# Patient Record
Sex: Female | Born: 1965 | Race: White | Hispanic: No | Marital: Married | Smoking: Never smoker
Health system: Southern US, Community
[De-identification: ages and names within clinical notes are randomized; demographics above are authoritative.]

## PROBLEM LIST (undated history)

## (undated) DIAGNOSIS — B009 Herpesviral infection, unspecified: Secondary | ICD-10-CM

## (undated) DIAGNOSIS — M1990 Unspecified osteoarthritis, unspecified site: Secondary | ICD-10-CM

## (undated) DIAGNOSIS — S4290XA Fracture of unspecified shoulder girdle, part unspecified, initial encounter for closed fracture: Secondary | ICD-10-CM

## (undated) DIAGNOSIS — Z8742 Personal history of other diseases of the female genital tract: Secondary | ICD-10-CM

## (undated) DIAGNOSIS — M8580 Other specified disorders of bone density and structure, unspecified site: Secondary | ICD-10-CM

## (undated) DIAGNOSIS — Z87442 Personal history of urinary calculi: Secondary | ICD-10-CM

## (undated) DIAGNOSIS — I319 Disease of pericardium, unspecified: Secondary | ICD-10-CM

## (undated) DIAGNOSIS — Z9889 Other specified postprocedural states: Secondary | ICD-10-CM

## (undated) DIAGNOSIS — N201 Calculus of ureter: Secondary | ICD-10-CM

## (undated) DIAGNOSIS — Z973 Presence of spectacles and contact lenses: Secondary | ICD-10-CM

## (undated) DIAGNOSIS — R319 Hematuria, unspecified: Secondary | ICD-10-CM

## (undated) DIAGNOSIS — E785 Hyperlipidemia, unspecified: Secondary | ICD-10-CM

## (undated) DIAGNOSIS — G43909 Migraine, unspecified, not intractable, without status migrainosus: Secondary | ICD-10-CM

## (undated) DIAGNOSIS — I10 Essential (primary) hypertension: Secondary | ICD-10-CM

## (undated) DIAGNOSIS — R112 Nausea with vomiting, unspecified: Secondary | ICD-10-CM

## (undated) HISTORY — DX: Herpesviral infection, unspecified: B00.9

## (undated) HISTORY — DX: Disease of pericardium, unspecified: I31.9

## (undated) HISTORY — DX: Fracture of unspecified shoulder girdle, part unspecified, initial encounter for closed fracture: S42.90XA

## (undated) HISTORY — DX: Other specified disorders of bone density and structure, unspecified site: M85.80

## (undated) HISTORY — PX: KIDNEY STONE SURGERY: SHX686

---

## 1991-06-10 HISTORY — PX: DILATION AND EVACUATION: SHX1459

## 1998-03-23 ENCOUNTER — Other Ambulatory Visit: Admission: RE | Admit: 1998-03-23 | Discharge: 1998-03-23 | Payer: Self-pay | Admitting: *Deleted

## 1999-03-28 ENCOUNTER — Other Ambulatory Visit: Admission: RE | Admit: 1999-03-28 | Discharge: 1999-03-28 | Payer: Self-pay | Admitting: *Deleted

## 1999-12-11 ENCOUNTER — Inpatient Hospital Stay (HOSPITAL_COMMUNITY): Admission: AD | Admit: 1999-12-11 | Discharge: 1999-12-11 | Payer: Self-pay | Admitting: Obstetrics and Gynecology

## 1999-12-11 ENCOUNTER — Inpatient Hospital Stay (HOSPITAL_COMMUNITY): Admission: AD | Admit: 1999-12-11 | Discharge: 1999-12-11 | Payer: Self-pay | Admitting: *Deleted

## 1999-12-12 ENCOUNTER — Inpatient Hospital Stay (HOSPITAL_COMMUNITY): Admission: AD | Admit: 1999-12-12 | Discharge: 1999-12-12 | Payer: Self-pay | Admitting: Obstetrics and Gynecology

## 2000-02-11 ENCOUNTER — Inpatient Hospital Stay: Admission: AD | Admit: 2000-02-11 | Discharge: 2000-02-11 | Payer: Self-pay | Admitting: Obstetrics and Gynecology

## 2000-02-20 ENCOUNTER — Inpatient Hospital Stay (HOSPITAL_COMMUNITY): Admission: AD | Admit: 2000-02-20 | Discharge: 2000-02-22 | Payer: Self-pay | Admitting: *Deleted

## 2000-02-24 ENCOUNTER — Encounter: Admission: RE | Admit: 2000-02-24 | Discharge: 2000-03-03 | Payer: Self-pay | Admitting: *Deleted

## 2000-04-03 ENCOUNTER — Other Ambulatory Visit: Admission: RE | Admit: 2000-04-03 | Discharge: 2000-04-03 | Payer: Self-pay | Admitting: *Deleted

## 2001-04-14 ENCOUNTER — Other Ambulatory Visit: Admission: RE | Admit: 2001-04-14 | Discharge: 2001-04-14 | Payer: Self-pay | Admitting: *Deleted

## 2002-06-07 ENCOUNTER — Other Ambulatory Visit: Admission: RE | Admit: 2002-06-07 | Discharge: 2002-06-07 | Payer: Self-pay | Admitting: *Deleted

## 2003-06-12 ENCOUNTER — Other Ambulatory Visit: Admission: RE | Admit: 2003-06-12 | Discharge: 2003-06-12 | Payer: Self-pay | Admitting: *Deleted

## 2003-07-14 ENCOUNTER — Ambulatory Visit (HOSPITAL_COMMUNITY): Admission: RE | Admit: 2003-07-14 | Discharge: 2003-07-14 | Payer: Self-pay | Admitting: *Deleted

## 2003-07-14 HISTORY — PX: OTHER SURGICAL HISTORY: SHX169

## 2007-01-20 ENCOUNTER — Emergency Department (HOSPITAL_COMMUNITY): Admission: EM | Admit: 2007-01-20 | Discharge: 2007-01-21 | Payer: Self-pay | Admitting: Emergency Medicine

## 2007-02-15 ENCOUNTER — Ambulatory Visit (HOSPITAL_COMMUNITY): Admission: RE | Admit: 2007-02-15 | Discharge: 2007-02-15 | Payer: Self-pay | Admitting: Urology

## 2007-02-15 HISTORY — PX: OTHER SURGICAL HISTORY: SHX169

## 2007-03-08 ENCOUNTER — Ambulatory Visit (HOSPITAL_COMMUNITY): Admission: RE | Admit: 2007-03-08 | Discharge: 2007-03-08 | Payer: Self-pay | Admitting: Urology

## 2007-06-17 ENCOUNTER — Encounter (INDEPENDENT_AMBULATORY_CARE_PROVIDER_SITE_OTHER): Payer: Self-pay | Admitting: *Deleted

## 2007-06-17 ENCOUNTER — Ambulatory Visit (HOSPITAL_COMMUNITY): Admission: RE | Admit: 2007-06-17 | Discharge: 2007-06-17 | Payer: Self-pay | Admitting: *Deleted

## 2007-06-17 HISTORY — PX: OTHER SURGICAL HISTORY: SHX169

## 2007-12-13 ENCOUNTER — Ambulatory Visit (HOSPITAL_COMMUNITY): Admission: RE | Admit: 2007-12-13 | Discharge: 2007-12-13 | Payer: Self-pay | Admitting: Urology

## 2009-10-22 ENCOUNTER — Encounter: Admission: RE | Admit: 2009-10-22 | Discharge: 2009-10-22 | Payer: Self-pay | Admitting: Family Medicine

## 2010-10-22 NOTE — Op Note (Signed)
NAMEMELINA, [name]              ACCOUNT NO.:  [id number]   MEDICAL RECORD NO.:  [id number]          PATIENT TYPE:  AMB   LOCATION:  DAY                          FACILITY:  [facility]   PHYSICIAN:  Valetta Fuller, M.D.  DATE OF BIRTH:  07-13-1965   DATE OF PROCEDURE:  02/15/2007  DATE OF DISCHARGE:                               OPERATIVE REPORT   PREOPERATIVE DIAGNOSIS:  Right distal ureteral calculus.   POSTOPERATIVE DIAGNOSIS:  Right distal ureteral calculus.   PROCEDURE PERFORMED:  1. Cystoscopy.  2. Right ureteroscopy.  3. Holmium laser lithotripsy with basketing the stone fragments.   SURGEON:  Valetta Fuller, M.D.   ANESTHESIA:  General.   INDICATIONS:  [name] is a 45 year old female.  She had previously  had an episode of nephrolithiasis several years back.  She re-presented  with right flank pain and was diagnosed with a 4-mm right ureteral  stone.  She also had numerous bilateral renal calculi.  We saw her for  followup and her symptoms seemed to have resolved.  We thought that the  stone had indeed passed spontaneously and we set her up to treat  initially the contralateral left kidney with lithotripsy.  That  procedure was scheduled actually for today, but the patient had  contacted me with some twinges of right-sided recurrent abdominal pain.  For that reason, a CT was performed and indeed, the right ureteral stone  was still present and stuck in the intramural ureter without  hydronephrosis.  We felt it prudent at that point to go ahead and  definitively treat the right ureteral stone before embarking on the left-  sided lithotripsy.  We also felt that given 3-4 weeks had passed, it was  time to go and definitively manage the right ureteral calculus.   TECHNIQUE AND FINDINGS:  The patient was brought to the operating room,  where she had successful induction of general anesthesia.  She was  prepped and draped in the usual manner and placed in the mid  lithotomy  position.  Cystoscopy revealed right ureteral orifice mucosal edema.  One could see within the orifice a stone just distal to the orifice.  It  was actually not impacted.  There was therefore no need for retrograde  pyelogram.  Through the cystoscope, a guidewire was placed up the right  renal pelvis without difficulty.  The cystoscope was removed and the  distal ureter was then engaged with the ureteroscope.  The stone  appeared to be about 5 mm in size.  It appeared to be slightly too big  to actually basket-extract without having to dilate the distal ureteral  orifice and for that reason, I went ahead and used a holmium laser fiber  to break the stone into about a half a dozen pieces.  These pieces were  then individually taken out of the ureter.  Because there was very  little in the way of any ureteral edema, no evidence of significant  obstruction and we felt that the ureteroscopy was done atraumatically,  that we could get  by without placing a double-J stent.  For  that reason, we just simply  remove the guidewire.  The patient appeared to tolerate the procedure  well, there were no obvious complications or difficulties and she was  brought to the recovery room in stable condition.           ______________________________  Valetta Fuller, M.D.  Electronically Signed     DSG/MEDQ  D:  02/15/2007  T:  02/16/2007  Job:  [id number]

## 2010-10-22 NOTE — Op Note (Signed)
[name], [name]              ACCOUNT NO.:  [id number]   MEDICAL RECORD NO.:  [id number]          PATIENT TYPE:  AMB   LOCATION:  SDC                           FACILITY:  [facility]   PHYSICIAN:  [place] B. Earlene Plater, M.D.  DATE OF BIRTH:  1965-08-10   DATE OF PROCEDURE:  06/17/2007  DATE OF DISCHARGE:                               OPERATIVE REPORT   PREOPERATIVE DIAGNOSES:  Persistent left ovarian cyst.   POSTOPERATIVE DIAGNOSES:  Persistent left ovarian cyst.   PROCEDURE:  Laparoscopic left ovarian cystectomy.   SURGEON:  Marina Gravel, M.D.   ASSISTANT:  None.   ANESTHESIA:  General.   FINDINGS:  Benign appearing cyst consistent with hemorrhagic corpus  luteum cyst left ovary.  Right ovary normal. Anterior posterior cul-de-  sac normal. Upper abdomen normal.   INDICATIONS:  Patient with a history of persistent left ovarian cyst.  On ultrasound appeared most consistent with a hemorrhagic cyst.  It was  persistent over several months with associated left lower quadrant pain.  Given this, the patient presented for definitive surgical diagnosis.  One area of the ovary of the cyst appeared more echogenic, possibly  consistent with a dermoid.  All preop tumor markers were normal.  The  patient advised of the risks of surgery including infection, bleeding  damage to surrounding organs.   PROCEDURE:  The patient is taken to the operating room.  General  anesthesia obtained.  She is prepped and draped in a standard fashion.  Foley catheter inserted into the bladder.   A 10 mm incision placed in the umbilicus, carried sharply to the fascia.  Fascia was divided sharply and elevated with Kocher clamps.  Posterior  sheath and peritoneum elevated with Allis clamp and entered sharply with  a knife.  Pursestring suture of zero Vicryl placed on the fascial  defect.  Hassan cannula inserted and secured.  Pneumoperitoneum obtained  with CO2 gas.  11 mm XL trocar placed in left lower quadrant, 5  mm in  the right, each under direct laparoscopic visualization.  Trendelenburg  position obtained, the pelvis inspected.  The above findings noted.  The  left ovary was also adherent to the left pelvic sidewall directly over  the ureter.  However, the area that appeared most like the cyst was in  the medial most portion of the ovary (i.e. as far away from the sidewall  as anatomically possible).  Therefore I decided to proceed with a simple  ovarian cystectomy without dissecting out the ureter.  The pole of the  ovarian cyst was grasped with a grasper and the capsule was incised with  monopolar scissors.  Some clear fluid returned, but the majority of the  cyst was removed intact and appeared consistent with a hemorrhagic  corpus luteum cyst.  The cyst bed was made hemostatic with the monopolar  cautery.  The pelvis was irrigated and the pneumoperitoneum taken down.  There was no bleeding.  Therefore procedure was terminated.   The inferior ports were removed.  Their sites were hemostatic.  The  scope was removed.  Gas released.  Hassan cannula removed.  The  umbilical incision was elevated with [employer] retractor and a  pursestring suture snugged down.  This obliterated the fascial defect  and no intra-abdominal contents herniated through prior to closure.  Subcutaneous tissue was reapproximated with 4-0 Vicryl and skin closed  with Dermabond at each site.   The patient tolerated the well with no complications.  She was taken to  recovery room in stable condition.  All counts correct per the operating  staff.      [name] B. Earlene Plater, M.D.  Electronically Signed     WBD/MEDQ  D:  06/17/2007  T:  06/17/2007  Job:  [id number]

## 2010-10-25 NOTE — Op Note (Signed)
[facility]  Patient:    [name]                       MRN: [id number] Proc. Date: 02/20/00 Adm. Date:  [id number] Attending:  Marina Gravel B                           Operative Report  PREOPERATIVE DIAGNOSIS:       Maternal exhaustion.  POSTOPERATIVE DIAGNOSIS:      Maternal exhaustion.  PROCEDURE:                    Low forceps delivery.  SURGEON:                      Marina Gravel, M.D.  INDICATIONS:                  This 45 year old white female, gravida 2, para 0, abortus 1, had been pushing for approximately 90 minutes when she complained of inability to push any further.  She had had a normal labor course and progressed rapidly to complete, complete and +2.  Fetal descent was noted with each maternal push.  However, maternal pushing effort was suboptimal.  Estimated fetal weight was 7 to 7-1/2 pounds.  The patient was seen to have an adequate pelvis.  Informed consent was obtained and the procedure was discussed with the patient and her husband.  This is documented in the chart.  DESCRIPTION OF PROCEDURE:     Using epidural anesthesia, Luikart forceps were used.  The position was LOA, approximately 5 degrees of rotation and between +2 and +3 station.  Easy application was made.  Verification of placement of the forceps was undertaken.  The midline was noted to be in the center of the forceps blades.  The Lambdoid sutures were made noted to be equally spaced off of the medial edge of the forceps on each side.  WIth gentle traction x 2 during one contraction, the head was easily delivered.  It was again noted to be LOA.  The forceps blades were removed. The nose and mouth were suctioned with the bulb and the remainder of the infant delivered without difficulty.  The cord was clamped and cut.  The infant was placed on the mothers belly.  The placenta was expressed with fundal massage.  The uterus rapidly contracted and there was a  normal amount of bleeding, which rapidly stopped.  Vaginal packs were placed in the vagina and laceration was inspected.  A third degree midline laceration was noted.  In addition, an approximate 5 cm in length vaginal sulcus tear was also noted.  The lacerations were repaired as follows:  First, the sulcus tear was reapproximated with a running locking stitch of 2-0 chromic.  Next the upper portion of the upper vaginal portion of the third degree laceration was reapproximated.  This was done with a running locking stitch of 2-0 chromic. Next, the rectal sphincter was identified and grasped at each side with Allis clamps.  A ______ glove was placed on the examining hand and rectal exam performed.  The rectal mucosa was intact.  The skin overlying the anal verge had also been involved in the laceration.  The anal sphincter was reapproximated fingerlay sutures of 2-0 Vicryl in an overlapping fashion.  Four separate sutures were placed at 3, 6, 9 and 12 oclock respectively.  Adequate reapproximation of  the sphincter was noted. The perineum was then reapproximated with interrupted figure-of-eight sutures of 2-0 Vicryl.  The skin overlying the anal verge was reapproximated with interrupted simple 3-0 Vicryl.  The skin was then closed in a running subcuticular stitch of 3-0 Vicryl.  Of note, the bladder had been continuously draining to Foley catheter. Suprapubic pressure was placed just prior to placement of the forceps, and no further urine drained.  Therefore, the Foley catheter was removed just prior to placement of the forceps.  The patient tolerated the procedure well.  There were no complications.  There were no visible injuries to the newborn female.  Of note, the Apgars were 9 and 9.  The weight was pending at the time of this dictation.  The placenta was inspected and appeared to be completely intact. DD:  02/20/00 TD:  02/22/00 Job: [id number]

## 2010-10-25 NOTE — H&P (Signed)
[name], [name]                           ACCOUNT NO.:  [id number]   MEDICAL RECORD NO.:  [id number]                   PATIENT TYPE:  AMB   LOCATION:  SDC                                  FACILITY:  [facility]   PHYSICIAN:  [place] B. Earlene Plater, M.D.               DATE OF BIRTH:  05/25/1966   DATE OF ADMISSION:  DATE OF DISCHARGE:                                HISTORY & PHYSICAL   ADMISSION DIAGNOSES:  1. Abnormal bleeding desires minimally invasive surgical treatment.  2. Undesired fertility, desires sterilization.  3. History of previous left salpingectomy and endometriosis.   INTENDED PROCEDURE:  Hysteroscopy, dilation and curettage, endometrial  ablation with balloon therapy and laparoscopic tubal ligation.   HISTORY OF PRESENT ILLNESS:  [age]-year-old white female gravida 2,  para 1, AB 1 with a history of irregular bleeding using the Mirena IUD.  Has  had irregular almost daily bleeding for the last several months.  Pelvic  ultrasound has confirmed proper placement of the IUD.  Patient is interested  in sterilization and given the irregular bleeding removal of the IUD and  surgical treatment to ensure she has no further issues with this.   PAST MEDICAL HISTORY:  1. Endometriosis.  2. High cholesterol.  3. Kidney stones and migraines.   PAST SURGICAL HISTORY:  Laparoscopy with left salpingectomy and left ovarian  cystectomy for serocyst adenofibroma with fulguration of endometriosis, D&C  for missed AB, and forceps delivery for maternal exhaustion.   MEDICATION:  Zomig for migraines.   ALLERGIES:  PENICILLIN and CEPHALOSPORIN.   SOCIAL HISTORY:  Noncontributory.   FAMILY HISTORY:  Noncontributory.   REVIEW OF SYSTEMS:  Otherwise negative.   PHYSICAL EXAMINATION:  VITAL SIGNS:  Blood pressure 136/88, pulse 84, weight  130.  GENERAL:  Alert and oriented, no acute distress.  SKIN:  Warm, dry, no lesions.  HEART:  Regular rate and rhythm.  LUNGS:  Clear to  auscultation.  ABDOMEN:  Liver and spleen normal, no hernia.  BREAST:  No dominant masses, nipple discharge or adenopathy.  PELVIC:  Normal external genitalia.  Vagina and cervix normal.  Uterus is  normal size, anteverted, no adnexal masses or tenderness.   Pelvic ultrasound again shows fundally placed IUD with no other endometrial  or uterine abnormalities noted.  The ovaries also appeared normal.   ASSESSMENT:  Abnormal bleeding with normal ultrasound and desiring  definitive minimally invasive surgical therapy.   PLAN:  IUD removal, hysteroscopy D&C, endometrial ablation with the  Gynecare.  Also we will proceed with a laparoscopic tubal sterilization.  Operative risks discussed including infection, bleeding, uterine  perforation, damage to surrounding organs and fluid overload.  All questions  answered.  Patient wishes to proceed.  [name] B. Earlene Plater, M.D.    WBD/MEDQ  D:  07/12/2003  T:  07/12/2003  Job:  [id number]

## 2010-10-25 NOTE — H&P (Signed)
[facility]  Patient:    CORDA, SHUTT Visit Number: [id number] MRN: [id number]          Service Type: OBS Location: 910B [id number] 01 Attending Physician:  Ermalene Searing Admit Date:  02/20/2000                           History and Physical  ADMITTING DIAGNOSES:          1. Thirty-seven and 5/7-weeks intrauterine                                  pregnancy.                               2. Pregnancy-induced hypertension and possible                                  preeclampsia.  HISTORY OF PRESENT ILLNESS:   The patient is a 45 year old white female, gravida 2, para 0, A1, who presented today at 37 weeks 5 days for a routine OB visit.  At that time, patient had complaints of visual disturbances, seeing spots.  No headache or epigastric pain.  At her previous visit on September 10th, she was noted to have a new elevation of blood pressure at 160/90.  This was rechecked by the physician who saw her that day at 100/72. At that point, the patient had no complaints.  Her prenatal care has been at [facility] with Dr. Sung Amabile. Roslyn Smiling as her primary obstetrician.  Risk factors included advanced maternal age, history of migraines and a history of right renal calculus; in addition, ultrasound detected fetal pyelectasis on the right side, which has been stable in the 0.8-cm range.  The patients pediatrician, Dr. Leonette Most B. Genelle Bal, is aware of this condition and will follow up on this after her child is born.  Otherwise, her prenatal care has been uncomplicated.  Patient reports some fetal activity; however, has been decreased over the last week.  In fact, we have been monitoring patient with twice-weekly NSTs, which have always been reactive.  She reports irregular contractions, no leakage of fluid or vaginal bleeding.  Dated criteria:  Last menstrual period May 31, 1999, [facility] March 07, 2000; this was consistent with a 5.4-week ultrasound.  The  fetal anatomical surveys have been normal with the exception of the right pyelectasis, last checked August 2nd at 0.8 cm.  PRENATAL LABORATORY DATA:     A-positive, rubella equivocal.  Antibody screen negative.  Hepatitis B, HIV, RPR, GC and Chlamydia all negative.  Pap smear normal, October 2000.  PAST MEDICAL HISTORY:         Nephrolithiasis.  PAST OBSTETRICAL HISTORY:     Spontaneous Ab x 1.  PAST SURGICAL HISTORY:        Exploratory laparotomy for removal of a left paratubal cyst and ovarian cyst.  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  MEDICATIONS:                  Prenatal vitamins.  ALLERGIES:                    PENICILLIN and CEPHALOSPORINS; these cause rash and hives.  SOCIAL HISTORY:               No alcohol, tobacco or other drugs.  PHYSICAL EXAMINATION  VITALS:                       Weight 153 pounds.  Blood pressure 132/90. Fetal heart tones 152.  I rechecked the patients blood pressure in the office after resting quietly for approximately 10 minutes.  At that time, it was 140/94.  Urinalysis dipped negative for protein in office today.  GENERAL:                      The patient is alert and oriented, in no acute distress.  SKIN:                         Warm and dry and no lesions.  EXTREMITIES:                  There is 2+ lower extremity edema noted. Reflexes are 4+ and approximately one beat of clonus.  ABDOMEN:                      Fundus is gravid, nontender, appropriate fundal height noted.  PELVIC:                       Cervix is 3- to 4-cm dilated, 70% effaced, -1 station, vertex presentation.  ASSESSMENT:                   Pregnancy-induced hypertension, probably has preeclampsia.  Patient has complaints of visual disturbances and persistently elevated blood pressures at term.  Also of note, patient has marked hyperreflexia.  Given her favorable cervix and her proximity to term, I  have recommended patient be admitted to labor and delivery for induction of labor. Patient understands that there is a very small risk of complications from induction at 37-5/7 weeks.  I informed patient there is a very rare risk of fetal lungs not being mature; however, at this point, it is so rare that I do not think that amniocentesis is warranted, given her indication for delivery. I also discussed with patient that induction of labor does increase her risk of cesarean section somewhat; however, she has a very favorable cervix and I think this risk is low.  All questions were answered.  Patient wishes to proceed.  Her group B streptococcus status is known to be negative.  We will start Pitocin and magnesium for seizure prophylaxis, check pregnancy-induced hypertension labs and anticipate a spontaneous vaginal delivery. Attending Physician:  Marina Gravel B DD:  02/20/00 TD:  02/20/00 Job: [id number]

## 2010-10-25 NOTE — Op Note (Signed)
NAMEMELONEY, FELD                          ACCOUNT NO.:  [id number]   MEDICAL RECORD NO.:  [id number]                   PATIENT TYPE:  AMB   LOCATION:  SDC                                  FACILITY:  [facility]   PHYSICIAN:  [place] B. Earlene Plater, M.D.               DATE OF BIRTH:  12-22-1965   DATE OF PROCEDURE:  07/14/2003  DATE OF DISCHARGE:                                 OPERATIVE REPORT   PREOPERATIVE DIAGNOSES:  1. Abnormal bleeding, desires minimally invasive therapy.  2. Desires tubal sterilization.   POSTOPERATIVE DIAGNOSES:  1. Abnormal bleeding, desires minimally invasive therapy.  2. Desires tubal sterilization.   PROCEDURES:  1. Intrauterine device removal.  2. Laparoscopic tubal sterilization of the right tube (previous partial left     salpingectomy).  3. Hysteroscopy and Gynecare endometrial balloon ablation.   SURGEON:  [place]ster [place]stein. Earlene Plater, M.D.   ANESTHESIA:  General.   FINDINGS:  At laparoscopy, previous left partial salpingectomy, normal-  appearing ovaries bilaterally, normal uterus, normal right tube.  Normal  endometrium on hysteroscopy.   FLUID DEFICIT:  50 mL normal saline.   ESTIMATED BLOOD LOSS:  Less than 50 mL.   COMPLICATIONS:  None.   HISTORY:  Patient with a history of abnormal bleeding using the __________  IUD for greater than the last year.  Office ultrasound showed no  abnormalities and a properly-positioned IUD.  The patient desired definitive  therapy which was minimally invasive and tubal sterilization.  Given that  she had had a previous partial left salpingectomy, I could not proceed with  an Essure; therefore, we proceeded with a laparoscopic tubal.   DESCRIPTION OF PROCEDURE:  The patient was taken to the operating room and  general anesthesia obtained.  She was prepped and draped in the standard  fashion and the bladder emptied with a red rubber catheter.  A speculum  inserted, Hulka tenaculum inserted and attached.   A 10 mm vertical  infraumbilical skin fold incision made with a knife.  Carried sharply to the fascia.  The fascia was divided sharply and elevated  with Kocher clamps.  Posterior sheath and peritoneum were elevated and  entered sharply.  A pursestring suture of 0 Vicryl placed around the fascial  defect, Hasson cannula inserted and secured.  Pneumoperitoneum inserted with  CO2 gas.   Trendelenburg position obtained, the pelvis inspected, and the above  findings noted.  There was no identifiable left tube to cauterize.  The  right tube was identified and followed to its fimbriated end.  It was  cauterized with bipolar cautery in triple burn fashion approximately 2 cm  distal to the cornu.   The scope was removed, gas released, and Hasson cannula removed.  I inserted  my index finger through the fascial defect and snugged down the pursestring  suture.  This obliterated the fascial defect and no intra-abdominal contents  herniated through prior to closure.  The skin was closed with a running  stitch of 4-0 Vicryl.   Attention was turned to the vagina.  The Hulka was removed, speculum  inserted, and single-tooth attached.  The uterus sounded to 7.5 cm.  The  cervix was patulous and easily allowed passage of the diagnostic  hysteroscope, which was inserted after being flushed with saline.  The  cavity was inspected.  Both tubal ostia were visualized.  There was no  abnormality of the endometrium.   The Gynecare balloon was primed and inserted into the cavity.  It was  distended per protocol to a pressure of 180.  The heating cycle was then  initiated for eight minutes and then through the standard cool-down cycle.  The balloon was de-pressurized and removed.  The pressure was stable  throughout the procedure within acceptable limits.   The diagnostic hysteroscope was reinserted and the endometrial cavity  inspected.  Good coverage was obtained and no evidence of perforation.   The single-tooth was  removed and the cervix was hemostatic.   The patient tolerated the procedure well with no complications.  She was  taken to the recovery room awake, alert, in stable condition.                                               [name] B. Earlene Plater, M.D.    WBD/MEDQ  D:  07/14/2003  T:  07/15/2003  Job:  [id number]

## 2011-02-26 LAB — CBC
HCT: 39.2
MCV: 85.3
RBC: 4.59

## 2011-02-26 LAB — DIFFERENTIAL
Eosinophils Relative: 1
Lymphocytes Relative: 26
Lymphs Abs: 2.2
Monocytes Relative: 6
Neutrophils Relative %: 67

## 2011-03-06 LAB — BASIC METABOLIC PANEL
Chloride: 107
Creatinine, Ser: 0.74
GFR calc Af Amer: >60
GFR calc non Af Amer: >60
Glucose, Bld: 93
Sodium: 139

## 2011-03-06 LAB — PREGNANCY, URINE: Preg Test, Ur: NEGATIVE

## 2011-03-20 LAB — PREGNANCY, URINE: Preg Test, Ur: NEGATIVE

## 2011-03-21 LAB — BASIC METABOLIC PANEL
BUN: 8
Calcium: 8.8
GFR calc non Af Amer: >60
Sodium: 136

## 2011-03-21 LAB — HEMOGLOBIN AND HEMATOCRIT, BLOOD: HCT: 41.6

## 2011-03-21 LAB — PREGNANCY, URINE: Preg Test, Ur: NEGATIVE

## 2011-03-24 LAB — URINALYSIS, ROUTINE W REFLEX MICROSCOPIC
Bilirubin Urine: NEGATIVE
Glucose, UA: NEGATIVE
Leukocytes, UA: NEGATIVE
Specific Gravity, Urine: 1.015
pH: 7

## 2011-03-24 LAB — I-STAT 8, (EC8 V) (CONVERTED LAB)
Acid-Base Excess: 1
Chloride: 103
Glucose, Bld: 119 — ABNORMAL HIGH
HCT: 43
Hemoglobin: 14.6
Operator id: 208821
TCO2: 28
pCO2, Ven: 45.2

## 2011-03-24 LAB — DIFFERENTIAL
Basophils Relative: 1
Lymphocytes Relative: 6 — ABNORMAL LOW
Lymphs Abs: 1
Monocytes Absolute: 0.6
Monocytes Relative: 3

## 2011-03-24 LAB — CBC
HCT: 38.6
MCHC: 34.3
Platelets: 228
RBC: 4.63
WBC: 16.5 — ABNORMAL HIGH

## 2011-03-24 LAB — POCT PREGNANCY, URINE: Operator id: 208821

## 2012-09-20 ENCOUNTER — Encounter (HOSPITAL_COMMUNITY): Payer: Self-pay | Admitting: *Deleted

## 2012-09-20 ENCOUNTER — Emergency Department (HOSPITAL_COMMUNITY): Payer: BC Managed Care – PPO

## 2012-09-20 ENCOUNTER — Emergency Department (HOSPITAL_COMMUNITY)
Admission: EM | Admit: 2012-09-20 | Discharge: 2012-09-20 | Disposition: A | Discharge status: Home / Self Care | Payer: BC Managed Care – PPO | Attending: Emergency Medicine | Admitting: Emergency Medicine

## 2012-09-20 DIAGNOSIS — I10 Essential (primary) hypertension: Secondary | ICD-10-CM | POA: Insufficient documentation

## 2012-09-20 DIAGNOSIS — M549 Dorsalgia, unspecified: Secondary | ICD-10-CM | POA: Insufficient documentation

## 2012-09-20 DIAGNOSIS — R112 Nausea with vomiting, unspecified: Secondary | ICD-10-CM | POA: Insufficient documentation

## 2012-09-20 DIAGNOSIS — Z8639 Personal history of other endocrine, nutritional and metabolic disease: Secondary | ICD-10-CM | POA: Insufficient documentation

## 2012-09-20 DIAGNOSIS — R0602 Shortness of breath: Secondary | ICD-10-CM | POA: Insufficient documentation

## 2012-09-20 DIAGNOSIS — R209 Unspecified disturbances of skin sensation: Secondary | ICD-10-CM | POA: Insufficient documentation

## 2012-09-20 DIAGNOSIS — Z862 Personal history of diseases of the blood and blood-forming organs and certain disorders involving the immune mechanism: Secondary | ICD-10-CM | POA: Insufficient documentation

## 2012-09-20 DIAGNOSIS — G43909 Migraine, unspecified, not intractable, without status migrainosus: Secondary | ICD-10-CM | POA: Insufficient documentation

## 2012-09-20 DIAGNOSIS — N200 Calculus of kidney: Secondary | ICD-10-CM

## 2012-09-20 DIAGNOSIS — E7800 Pure hypercholesterolemia, unspecified: Secondary | ICD-10-CM | POA: Insufficient documentation

## 2012-09-20 DIAGNOSIS — E785 Hyperlipidemia, unspecified: Secondary | ICD-10-CM | POA: Insufficient documentation

## 2012-09-20 HISTORY — DX: Migraine, unspecified, not intractable, without status migrainosus: G43.909

## 2012-09-20 HISTORY — DX: Essential (primary) hypertension: I10

## 2012-09-20 LAB — URINALYSIS, MICROSCOPIC ONLY
Bilirubin Urine: NEGATIVE
Glucose, UA: NEGATIVE mg/dL
Specific Gravity, Urine: 1.012 (ref 1.005–1.030)
Urobilinogen, UA: 0.2 mg/dL (ref 0.0–1.0)

## 2012-09-20 LAB — COMPREHENSIVE METABOLIC PANEL
ALT: 10 U/L (ref 0–35)
Albumin: 3.7 g/dL (ref 3.5–5.2)
Alkaline Phosphatase: 115 U/L (ref 39–117)
Calcium: 9 mg/dL (ref 8.4–10.5)
Potassium: 4 mEq/L (ref 3.5–5.1)
Sodium: 138 mEq/L (ref 135–145)
Total Protein: 7.9 g/dL (ref 6.0–8.3)

## 2012-09-20 LAB — CBC WITH DIFFERENTIAL/PLATELET
Basophils Absolute: 0 10*3/uL (ref 0.0–0.1)
Basophils Relative: 1 % (ref 0–1)
Eosinophils Relative: 1 % (ref 0–5)
HCT: 40.4 % (ref 36.0–46.0)
Hemoglobin: 13.6 g/dL (ref 12.0–15.0)
Lymphocytes Relative: 21 % (ref 12–46)
MCHC: 33.7 g/dL (ref 30.0–36.0)
MCV: 80.3 fL (ref 78.0–100.0)
Monocytes Absolute: 0.3 10*3/uL (ref 0.1–1.0)
Monocytes Relative: 4 % (ref 3–12)
RDW: 12.8 % (ref 11.5–15.5)

## 2012-09-20 LAB — POCT I-STAT TROPONIN I: Troponin i, poc: 0 ng/mL (ref 0.00–0.08)

## 2012-09-20 MED ORDER — METOCLOPRAMIDE HCL 5 MG/ML IJ SOLN
10.0000 mg | Freq: Once | INTRAMUSCULAR | Status: AC
  Administered 2012-09-20: 10 mg via INTRAVENOUS
  Filled 2012-09-20: qty 2

## 2012-09-20 MED ORDER — PROMETHAZINE HCL 25 MG/ML IJ SOLN
25.0000 mg | Freq: Once | INTRAMUSCULAR | Status: AC
  Administered 2012-09-20: 25 mg via INTRAVENOUS
  Filled 2012-09-20: qty 1

## 2012-09-20 MED ORDER — METOCLOPRAMIDE HCL 10 MG PO TABS: 10.0000 mg | ORAL_TABLET | Freq: Three times a day (TID) | ORAL | Status: DC | PRN

## 2012-09-20 MED ORDER — FENTANYL CITRATE 0.05 MG/ML IJ SOLN
50.0000 ug | Freq: Once | INTRAMUSCULAR | Status: AC
  Administered 2012-09-20: 50 ug via INTRAVENOUS
  Filled 2012-09-20: qty 2

## 2012-09-20 MED ORDER — ONDANSETRON HCL 4 MG/2ML IJ SOLN: 4.0000 mg | Freq: Once | INTRAMUSCULAR | Status: DC

## 2012-09-20 MED ORDER — FENTANYL CITRATE 0.05 MG/ML IJ SOLN
50.0000 ug | Freq: Once | INTRAMUSCULAR | Status: AC
  Administered 2012-09-20: 50 ug via INTRAVENOUS

## 2012-09-20 MED ORDER — ONDANSETRON 4 MG PO TBDP
8.0000 mg | ORAL_TABLET | Freq: Once | ORAL | Status: AC
  Administered 2012-09-20: 8 mg via ORAL
  Filled 2012-09-20: qty 2

## 2012-09-20 MED ORDER — OXYCODONE-ACETAMINOPHEN 5-325 MG PO TABS: 1.0000 | ORAL_TABLET | ORAL | Status: DC | PRN

## 2012-09-20 MED ORDER — SODIUM CHLORIDE 0.9 % IV BOLUS (SEPSIS)
1000.0000 mL | Freq: Once | INTRAVENOUS | Status: AC
  Administered 2012-09-20: 1000 mL via INTRAVENOUS

## 2012-09-20 MED ORDER — HYDROMORPHONE HCL PF 1 MG/ML IJ SOLN
1.0000 mg | Freq: Once | INTRAMUSCULAR | Status: AC
  Administered 2012-09-20: 1 mg via INTRAVENOUS
  Filled 2012-09-20: qty 1

## 2012-09-20 MED ORDER — TAMSULOSIN HCL 0.4 MG PO CAPS: 0.4000 mg | ORAL_CAPSULE | Freq: Every day | ORAL | Status: DC

## 2012-09-20 NOTE — ED Provider Notes (Signed)
History     CSN: [id number]  Arrival date & time 09/20/12  4098   First MD Initiated Contact with Patient 09/20/12 1055    PCP: with [facility]   Chief Complaint  Patient presents with  . Abdominal Pain  . Back Pain  . Emesis    (Consider location/radiation/quality/duration/timing/severity/associated sxs/prior treatment) HPI 47 yo F with h/o HTN, HLD and kidney stones presenting to the ED with left sided abdominal pain.  She states that last night she had some numbness and tingling in her left arm.  This morning when she woke up around 7:15 and had left sided abdominal pain.  The pain has been constant and gradually worsening.  She called her PCP who recommended she go to [place] but they could not make it that far due to her pain.  Also has nausea and emesis of clear fluid x2 since arriving to the ED.  Denies urinary symptoms, constipation, diarrhea, blood in stool, sick contacts, fever.  Had some shortness of breath with activity on Saturday.  States this is different from her usual kidney stone pain.  Had a normal bowel movement this morning.   Past Medical History  Diagnosis Date  . Kidney stones   . Migraines   . Autoimmune disorder   . Hypertension   . High cholesterol     History reviewed. No pertinent past surgical history.  History reviewed. No pertinent family history.  History  Substance Use Topics  . Smoking status: Not on file  . Smokeless tobacco: Not on file  . Alcohol Use: No    OB History   Grav Para Term Preterm Abortions TAB SAB Ect Mult Living                  Review of Systems  Constitutional: Negative for fever and chills.  HENT: Negative.   Eyes: Negative.   Respiratory: Positive for shortness of breath (with activity on Saturday). Negative for cough.   Cardiovascular: Negative for chest pain, palpitations and leg swelling.  Gastrointestinal: Positive for nausea, vomiting and abdominal pain. Negative for diarrhea, constipation, blood in  stool and abdominal distention.  Genitourinary: Negative.   Musculoskeletal:       Left arm numbness and tingling  Neurological: Negative.     Allergies  Cephalosporins and Penicillins  Home Medications  No current outpatient prescriptions on file.  BP 128/77  Pulse 111  Temp(Src) 97.9 F (36.6 C) (Oral)  Resp 18  SpO2 100%  Physical Exam  Constitutional: She is oriented to person, place, and time. She appears well-developed and well-nourished. She appears distressed (intermittent shaking).  HENT:  Head: Normocephalic and atraumatic.  Right Ear: External ear normal.  Left Ear: External ear normal.  Mouth/Throat: Oropharynx is clear and moist.  Eyes: Conjunctivae and EOM are normal. Pupils are equal, round, and reactive to light. Right eye exhibits no discharge. Left eye exhibits no discharge. No scleral icterus.  Neck: Normal range of motion. Neck supple. No JVD present. No tracheal deviation present.  Cardiovascular: Regular rhythm, S1 normal, S2 normal and normal pulses.  Tachycardia present.   No murmur heard. Pulmonary/Chest: Effort normal and breath sounds normal. No respiratory distress. She has no wheezes. She has no rales.  Abdominal: Soft. Bowel sounds are normal. She exhibits no distension and no mass. There is tenderness (LLQ/flank). There is CVA tenderness (mild on left). There is no rebound and no guarding.  Musculoskeletal: She exhibits no edema and no tenderness.  Neurological: She is alert and  oriented to person, place, and time. No cranial nerve deficit. She exhibits normal muscle tone. Coordination normal.  Skin: Skin is warm and dry. No rash noted. She is not diaphoretic. No erythema.  Psychiatric: She has a normal mood and affect. Thought content normal.    ED Course  Procedures (including critical care time)  Labs Reviewed  COMPREHENSIVE METABOLIC PANEL - Abnormal; Notable for the following:    Glucose, Bld 134 (*)    All other components within  normal limits  CBC WITH DIFFERENTIAL  LIPASE, BLOOD  URINALYSIS, MICROSCOPIC ONLY  POCT I-STAT TROPONIN I   No results found.   No diagnosis found.    MDM  47 yo F with HTN, HLD, h/o kidney stones presenting with left sided abdominal pain and left arm numbness.  EKG negative for acute ischemia.  Troponin is negative.  Abdominal pain could be kidney stone, pyelonephritis, diverticulitis, ovarian torsion/cyst.  Pain is most consistent with kidney stone picture despite no urinary symptoms and history of pain being different from prior stones.  CBC, CMP and lipase are unremarkable.  Will check urine studies.  As she is tachycardic, will give 1L NS bolus.  Fentanyl for pain.   12:52 PM: Continued nausea despite zofran and phenergan.  Will give reglan and an additional of fentanyl.  Will check CT scan for kidney stone.  2:18 PM: CT positive for 6x66mm obstructing stone in the left ureter.  Pain and nausea is improved.  Urine not concerning for infection.  Will d/c home with percocet, reglan, and flomax.  She will f/u with her urologist this week.         Phebe Colla, MD 09/20/12 [phone]

## 2012-09-20 NOTE — ED Provider Notes (Signed)
I have supervised the resident on the management of this patient and agree with the note above. I personally interviewed and examined the patient and my addendum is below.   [name] is a 47 y.o. female hx of kidney stones here with LLQ pain. Acute onset this AM, radiate to L flank. No fevers. + vomiting. Mild LLQ tenderness and mild L CVAT. CT showed 6 mm stone, UA showed no UTI. Pain free on d/c. D/c home on percocet, reglan, flomax. She has urology f/u.    Richardean Canal, MD 09/20/12 [phone]

## 2012-09-20 NOTE — ED Notes (Signed)
Reports having onset of left arm tingling and fatigue yesterday. This am woke up with left side abd pain, back pain, n/v and chest discomfort. Hx of kidney stones, denies urinary symptoms.

## 2012-09-20 NOTE — ED Notes (Signed)
md aware pt is requesting pain medication

## 2014-04-08 IMAGING — CT CT ABD-PELV W/O CM
2 of 4 series · 14 of 32 positions shown, 19 images · non-contrast
Comparison: Ultrasound 07/13/2012.  Radiography 06/13/2010.

CLINICAL DATA: Left-sided abdominal pain and back pain.  Nausea and
vomiting.  History of kidney stones.

CT ABDOMEN AND PELVIS WITHOUT CONTRAST
TECHNIQUE: Multidetector CT imaging of the abdomen and pelvis was
performed following the standard protocol without intravenous
contrast.

[Series 2: renal stone · axial · 0.89mm/px · z∈[-413,-83]mm · 7 of 89 slices shown, 12 images]
[im 12/89  soft-tissue]
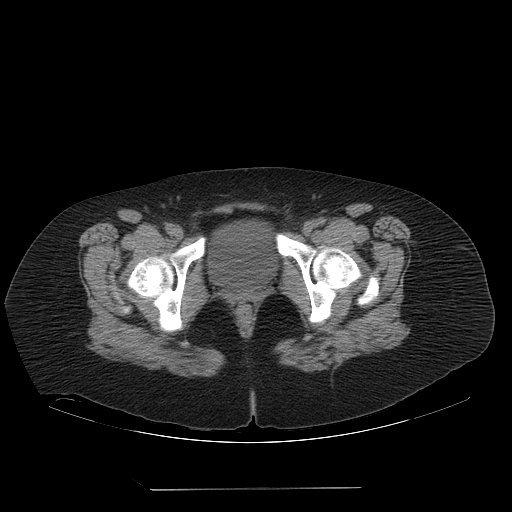
[im 12/89  bone]
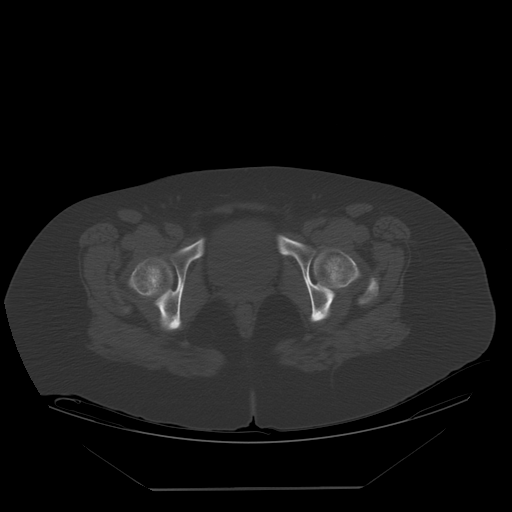
[im 23/89  soft-tissue]
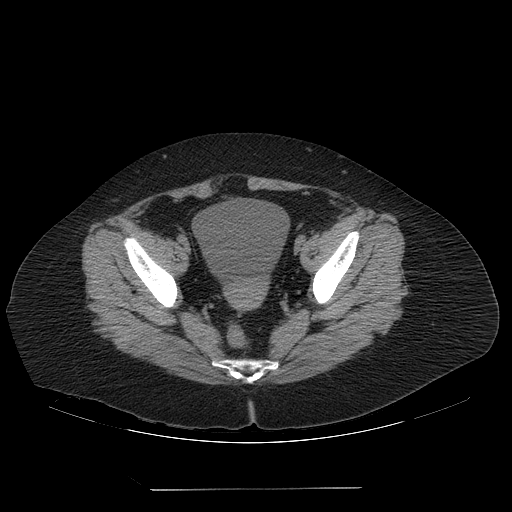
[im 34/89  soft-tissue]
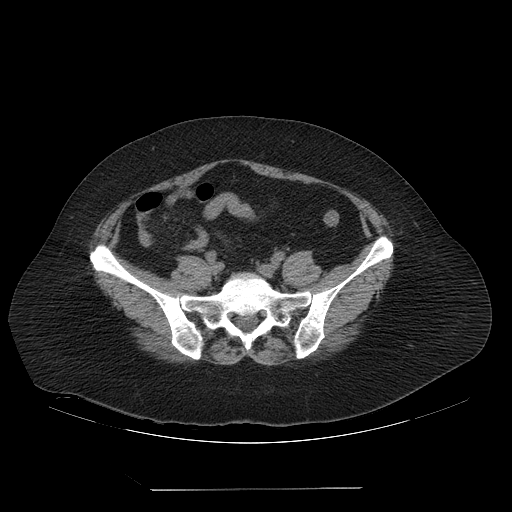
[im 45/89  soft-tissue]
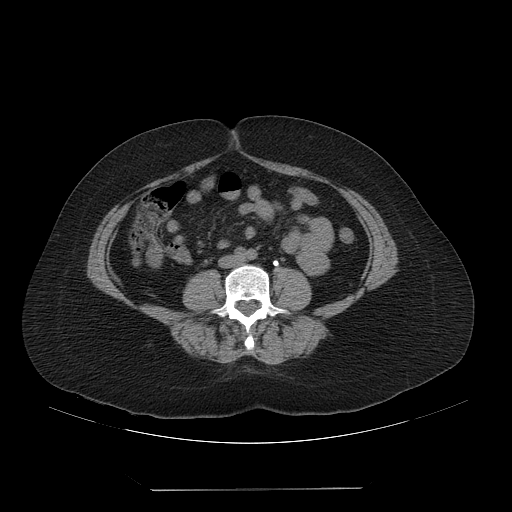
[im 45/89  lung]
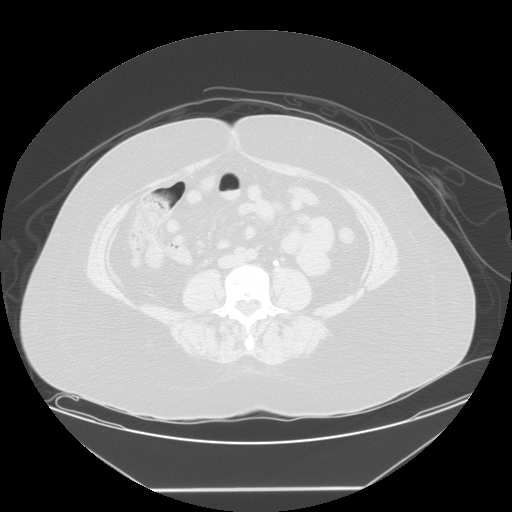
[im 56/89  soft-tissue]
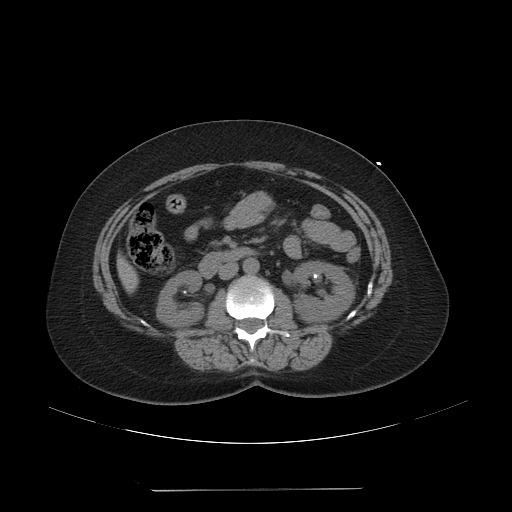
[im 56/89  lung]
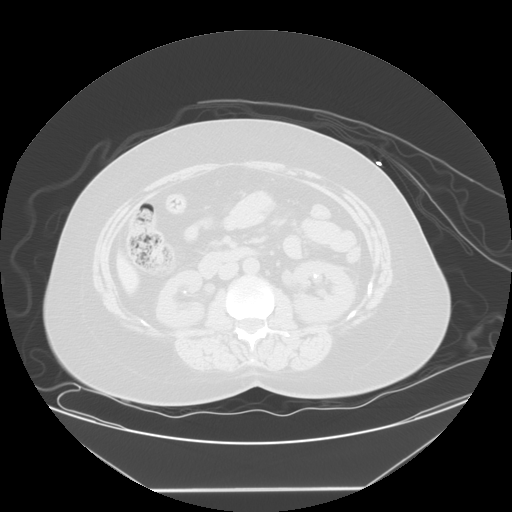
[im 67/89  soft-tissue]
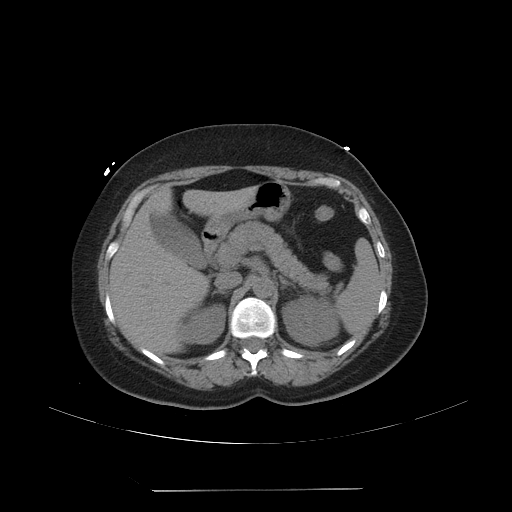
[im 67/89  lung]
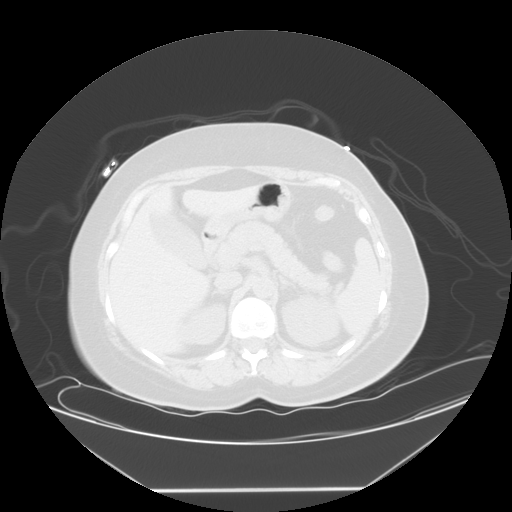
[im 78/89  soft-tissue]
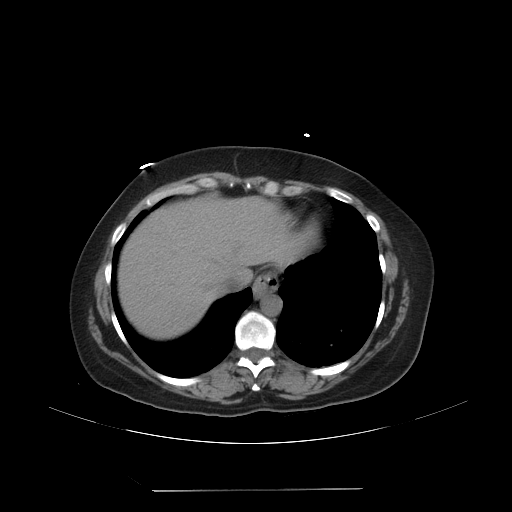
[im 78/89  lung]
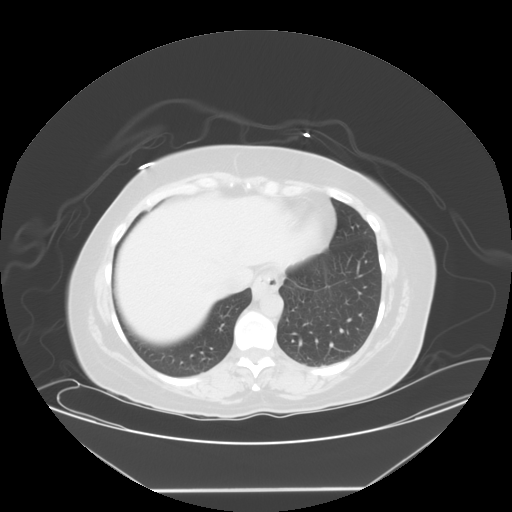

[Series 401: sagittals · sagittal · 0.89mm/px · 7 of 120 slices shown]
[im 11/120  soft-tissue]
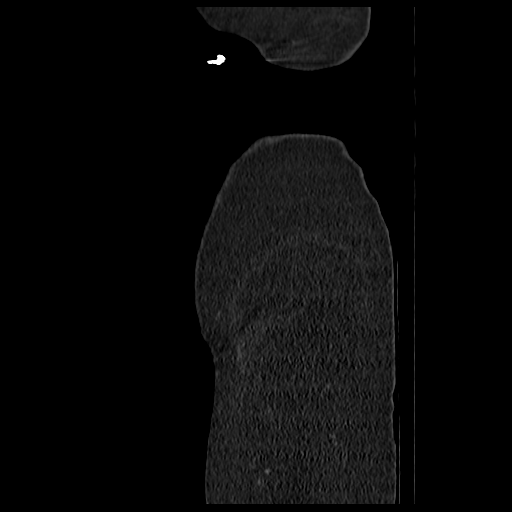
[im 22/120  soft-tissue]
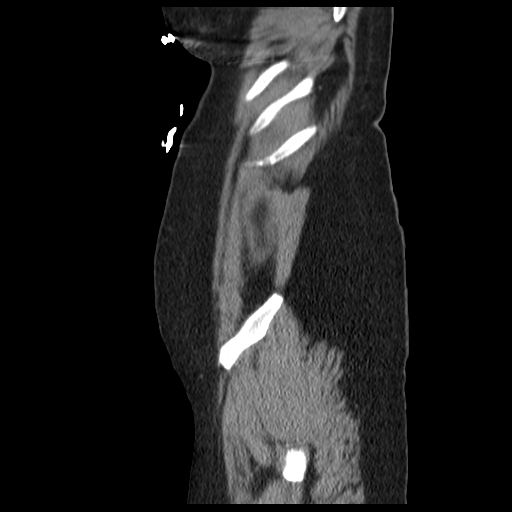
[im 44/120  soft-tissue]
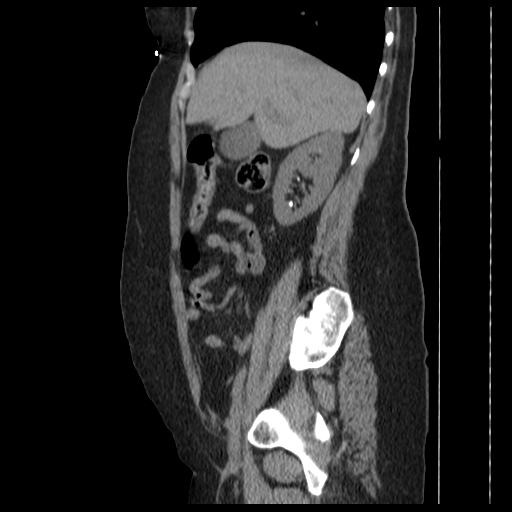
[im 55/120  soft-tissue]
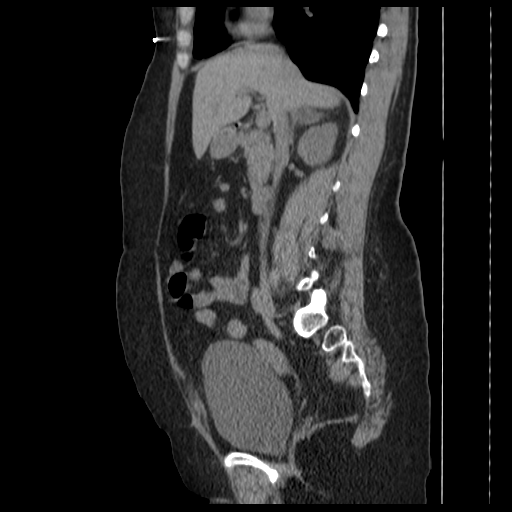
[im 65/120  soft-tissue]
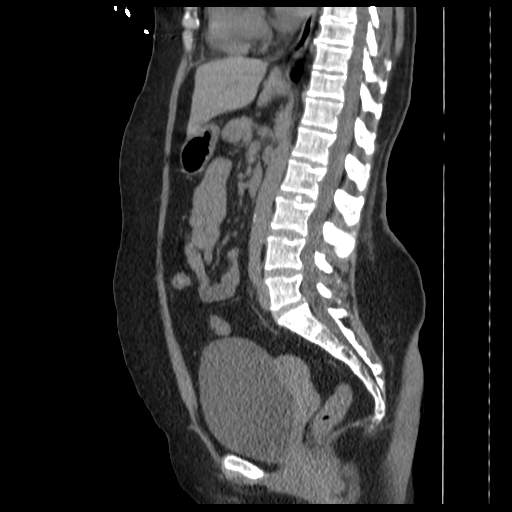
[im 76/120  soft-tissue]
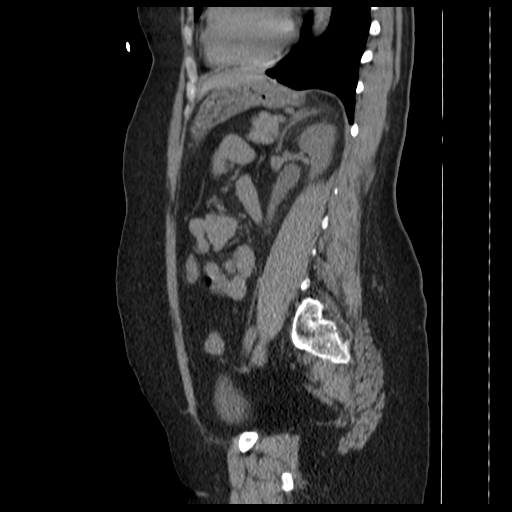
[im 98/120  soft-tissue]
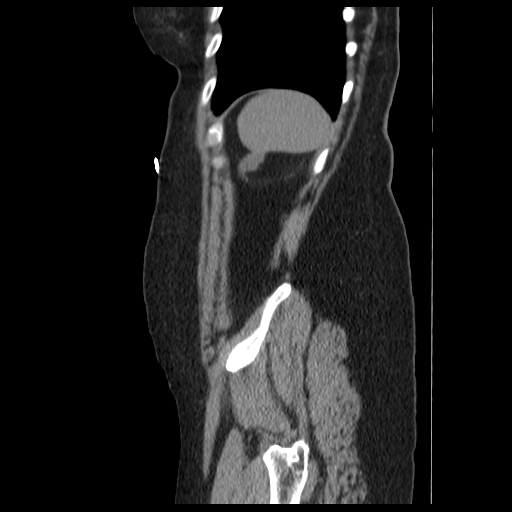

[14 of 32 positions shown; findings below may reference images not displayed]

FINDINGS: There is mild scarring or atelectasis dependently in the
left lower lobe.  The lung bases are otherwise clear.  No pleural
or pericardial fluid.

The liver has a normal appearance without contrast.  No calcified
gallstones.  The spleen is normal.  The pancreas is normal.  The
adrenal glands show normal appearance on the left and a a low
density mass on the right measuring 12 x 16 mm consistent with an
adenoma.  The right kidney contains approximately seven stones.
There is no evidence of passing stone or hydronephrosis on the
right.  The left kidney contains approximately five stones and
there is mild renal swelling and mild hydroureteronephrosis to the
level of the L4 vertebral body where there is a partially
obstructing stone measuring 6 x 9 mm.  No stone distal to that.
The patient does have one or two stones within the bladder
measuring 13 mm.

The aorta and IVC are unremarkable.  No retroperitoneal mass or
adenopathy.  No free intraperitoneal fluid or air.  No acute bowel
pathology.  No significant bony finding.
IMPRESSION: Multiple bilateral renal calculi.  Mild swelling of the left kidney
and mild hydroureteronephrosis on the left due to a 6 x 9 mm stone
in the ureter at the L4 level.

The patient also has a 13 mm bladder stone or two smaller stones
immediately adjacent to one other measuring 13 mm in
conglomeration.

## 2014-07-12 DIAGNOSIS — I10 Essential (primary) hypertension: Secondary | ICD-10-CM | POA: Diagnosis present

## 2014-07-12 HISTORY — DX: Essential (primary) hypertension: I10

## 2014-09-19 ENCOUNTER — Other Ambulatory Visit: Payer: Self-pay | Admitting: Urology

## 2014-10-02 ENCOUNTER — Encounter (HOSPITAL_BASED_OUTPATIENT_CLINIC_OR_DEPARTMENT_OTHER): Payer: Self-pay | Admitting: *Deleted

## 2014-10-02 NOTE — Progress Notes (Signed)
NPO AFTER MN. ARRIVE AT 0915. NEEDS ISTAT AND EKG. MAY TAKE PAIN RX IF NEEDED AM DOS W/ SIPS OF WATER.

## 2014-10-03 NOTE — H&P (Signed)
Active Problems Problems  1. Bilateral kidney stones (N20.0)   Assessed By: Jimmey Ralph (Urology); Last Assessed: 15 Sep 2014 2. Calculus of ureter (N20.1)   Assessed By: Jimmey Ralph (Urology); Last Assessed: 15 Sep 2014 3. Microscopic hematuria (R31.2)   Assessed By: Jimmey Ralph (Urology); Last Assessed: 15 Sep 2014  History of Present Illness 49 YO female patient of Dr. Cy Blamer seen today for a 6 week f/u.     GU Hx:  Hx of nephrolithiasis. She has a long history of stones. Previous metabolic studies have suggested very low volumes and low urinary citrate levels. She does remain on potassium citrate replacement. She has had ureteroscopy as well as several lithotripsies in the past. She passed a 6 x 9 mm stone at the time of her last assessment 2014. She is known to have multiple bilateral renal calculi. Her last CT scan suggested 7 stones on the right and follow up with a nonobstructing and relatively small.     Feb 2016 seen for routine annual followup. She has had a couple of issues. She had some nonspecific discomfort and underwent a recent cardiac evaluation, which was okay. Three weeks ago, she had a couple of episodes of painless, gross hematuria, which subsequently resolved. She has not recently had any right-sided flank discomfort.     On KUB Feb 2016, the right kidney is somewhat obscured by bowel gas. A previously well seen 4 mm stone is no longer present and see a suspicious 4 mm calcification in the area of her distal right ureter. On the left side, there appeared to be several small scattered stones, mostly in the 2-4 mm size range.     April 2016 Interval Hx:   Today states she has been pain free since last visit with no further gross hematuria but has not passed any stone material.   Vitals Vital Signs [Data Includes: Last 1 Day]  Recorded: 08Apr2016 11:11AM  Blood Pressure: 145 / 86 Temperature: 98.2 F Heart Rate: 90  Physical Exam Constitutional:  Well nourished and well developed . No acute distress. The patient appears well hydrated.  Abdomen: The abdomen is flat. The abdomen is soft and nontender. No suprapubic tenderness. No CVA tenderness.    Results/Data Urine [Data Includes: Last 1 Day]   08Apr2016  COLOR YELLOW   APPEARANCE CLEAR   SPECIFIC GRAVITY 1.020   pH 7.0   GLUCOSE NEG mg/dL  BILIRUBIN NEG   KETONE NEG mg/dL  BLOOD MOD   PROTEIN NEG mg/dL  UROBILINOGEN 0.2 mg/dL  NITRITE NEG   LEUKOCYTE ESTERASE NEG   SQUAMOUS EPITHELIAL/HPF FEW   WBC 0-2 WBC/hpf  RBC 11-20 RBC/hpf  BACTERIA FEW   CRYSTALS NONE SEEN   CASTS NONE SEEN   Other MUCUS NOTED    The following images/tracing/specimen were independently visualized:  KUB shows that distal right calcifications now appears to be in a Steinstrasse formation and appears to be between 3-4 stones in alignment. Will proceed with CT urogram:. which confirms bilateral nonobstructing renal calculi. Right proximal ureter appears slightly dilated but no overt hydronephrosis. Distally there appears to be 3-4 stones in alignment near UVJ.  The following clinical lab reports were reviewed:  UA: again has significant microscopic hematuria. Selected Results  AU CT-STONE PROTOCOL 08Apr2016 12:00AM Jimmey Ralph   Test Name Result Flag Reference  CT-STONE PROTOCOL (Report)    ** RADIOLOGY REPORT BY [facility] **   CLINICAL DATA: Left flank pain. Follow-up of urinary tract calculi. Hematuria. Prior  lithotripsy x2.  EXAM: CT ABDOMEN AND PELVIS WITHOUT CONTRAST  TECHNIQUE: Multidetector CT imaging of the abdomen and pelvis was performed following the standard protocol without IV contrast.  COMPARISON: Plain films of 09/15/2014. CT most recent 09/20/2012.  FINDINGS: Lower chest: Clear lung bases. No pleural fluid.  The liver and spleen are partially excluded superiorly.  Hepatobiliary: Normal in its imaged portion. Normal gallbladder, without biliary ductal  dilatation.  Pancreas: Normal, without mass or ductal dilatation.  Spleen: Normal  Adrenals/Urinary Tract: normal left adrenal gland. A right adrenal nodule is unchanged at 1.6 cm. Low-density, most consistent with an adenoma.  Bilateral punctate renal collecting system calculi.  No hydronephrosis. Although there is no significant hydroureter, clustered stones are identified within the distal right ureter. These measure up to 6 mm on coronal images 76 through 78.  Normal urinary bladder. No bladder calculi.  Stomach/Bowel: Normal stomach, without wall thickening. Normal colon, appendix, and terminal ileum. Normal small bowel.  Vascular/Lymphatic: Aortic and branch vessel atherosclerosis. No abdominopelvic adenopathy.  Reproductive: Normal uterus and adnexa.  Other: No significant free fluid.  Musculoskeletal: No acute osseous abnormality.  IMPRESSION: 1. Clustered distal right ureteric calculi, without significant hydroureter. 2. Bilateral nephrolithiasis. 3. Right adrenal adenoma.   Electronically Signed  By: Abigail Miyamoto M.D.  On: 09/15/2014 13:35   Assessment Assessed  1. Calculus of ureter (N20.1) 2. Microscopic hematuria (R31.2) 3. Bilateral kidney stones (N20.0)  Plan  Health Maintenance  1. UA With REFLEX; [Do Not Release]; Status:Complete;   Done: [id number] 10:43AM  Will have Dr. Risa Grill review KUB and CT and contact pt about treatment options ie ESWL vs cystourethroscopy, R RPG, stone extraction, and possible double J stent placement  Pt offered pain medication but refused stating she was ok   KUB; Status:Resulted - Requires Verification;  Done: [id number] 12:00AM  Due:10Apr2016; Marked Important;Ordered;   ZJI:RCVELFYB of ureter; Ordered OF:BPZWCH, Diane;     Annotations        order had to be reentered due to original order was activated the day it was scheduled - TSH, RTR  AU CT-STONE PROTOCOL; Status:Resulted - Requires Verification;  Done:  [id number] 12:00AM Due:10Apr2016; Marked Important;Ordered; Today;  PNT:IRWERXVQ of ureter, Microscopic hematuria; Ordered MG:QQPYPP, Diane;   Electronics engineer signed by : Jimmey Ralph, Trey Paula; Sep 15 2014  2:18PM EST

## 2014-10-04 ENCOUNTER — Ambulatory Visit (HOSPITAL_BASED_OUTPATIENT_CLINIC_OR_DEPARTMENT_OTHER): Payer: BLUE CROSS/BLUE SHIELD | Admitting: Anesthesiology

## 2014-10-04 ENCOUNTER — Ambulatory Visit (HOSPITAL_BASED_OUTPATIENT_CLINIC_OR_DEPARTMENT_OTHER)
Admission: RE | Admit: 2014-10-04 | Discharge: 2014-10-04 | Disposition: A | Discharge status: Home / Self Care | Payer: BLUE CROSS/BLUE SHIELD | Source: Ambulatory Visit | Attending: Urology | Admitting: Urology

## 2014-10-04 ENCOUNTER — Encounter (HOSPITAL_BASED_OUTPATIENT_CLINIC_OR_DEPARTMENT_OTHER)
Admission: RE | Disposition: A | Discharge status: Home / Self Care | Payer: Self-pay | Source: Ambulatory Visit | Attending: Urology

## 2014-10-04 ENCOUNTER — Encounter (HOSPITAL_BASED_OUTPATIENT_CLINIC_OR_DEPARTMENT_OTHER): Payer: Self-pay

## 2014-10-04 DIAGNOSIS — Z6832 Body mass index (BMI) 32.0-32.9, adult: Secondary | ICD-10-CM | POA: Insufficient documentation

## 2014-10-04 DIAGNOSIS — E669 Obesity, unspecified: Secondary | ICD-10-CM | POA: Insufficient documentation

## 2014-10-04 DIAGNOSIS — N201 Calculus of ureter: Secondary | ICD-10-CM | POA: Insufficient documentation

## 2014-10-04 DIAGNOSIS — R51 Headache: Secondary | ICD-10-CM | POA: Insufficient documentation

## 2014-10-04 DIAGNOSIS — I10 Essential (primary) hypertension: Secondary | ICD-10-CM | POA: Diagnosis not present

## 2014-10-04 DIAGNOSIS — M1990 Unspecified osteoarthritis, unspecified site: Secondary | ICD-10-CM | POA: Diagnosis not present

## 2014-10-04 DIAGNOSIS — Z87442 Personal history of urinary calculi: Secondary | ICD-10-CM | POA: Diagnosis not present

## 2014-10-04 DIAGNOSIS — R310 Gross hematuria: Secondary | ICD-10-CM | POA: Diagnosis present

## 2014-10-04 HISTORY — DX: Calculus of ureter: N20.1

## 2014-10-04 HISTORY — PX: CYSTOSCOPY WITH RETROGRADE PYELOGRAM, URETEROSCOPY AND STENT PLACEMENT: SHX5789

## 2014-10-04 HISTORY — DX: Personal history of urinary calculi: Z87.442

## 2014-10-04 HISTORY — DX: Hyperlipidemia, unspecified: E78.5

## 2014-10-04 HISTORY — DX: Presence of spectacles and contact lenses: Z97.3

## 2014-10-04 HISTORY — DX: Hematuria, unspecified: R31.9

## 2014-10-04 HISTORY — PX: HOLMIUM LASER APPLICATION: SHX5852

## 2014-10-04 HISTORY — DX: Other specified postprocedural states: Z98.890

## 2014-10-04 HISTORY — DX: Personal history of other diseases of the female genital tract: Z87.42

## 2014-10-04 HISTORY — DX: Unspecified osteoarthritis, unspecified site: M19.90

## 2014-10-04 HISTORY — DX: Nausea with vomiting, unspecified: R11.2

## 2014-10-04 LAB — POCT I-STAT 4, (NA,K, GLUC, HGB,HCT)
Glucose, Bld: 92 mg/dL (ref 70–99)
HCT: 44 % (ref 36.0–46.0)
Hemoglobin: 15 g/dL (ref 12.0–15.0)
Potassium: 4 mmol/L (ref 3.5–5.1)
Sodium: 138 mmol/L (ref 135–145)

## 2014-10-04 SURGERY — CYSTOURETEROSCOPY, WITH RETROGRADE PYELOGRAM AND STENT INSERTION
Anesthesia: General | Site: Ureter | Laterality: Right

## 2014-10-04 MED ORDER — MIDAZOLAM HCL 2 MG/2ML IJ SOLN
INTRAMUSCULAR | Status: AC
  Filled 2014-10-04: qty 2

## 2014-10-04 MED ORDER — SCOPOLAMINE 1 MG/3DAYS TD PT72
1.0000 | MEDICATED_PATCH | TRANSDERMAL | Status: DC
  Administered 2014-10-04: 1.5 mg via TRANSDERMAL
  Filled 2014-10-04: qty 1

## 2014-10-04 MED ORDER — IOHEXOL 350 MG/ML SOLN
INTRAVENOUS | Status: DC | PRN
  Administered 2014-10-04: 14 mL

## 2014-10-04 MED ORDER — KETOROLAC TROMETHAMINE 30 MG/ML IJ SOLN
INTRAMUSCULAR | Status: DC | PRN
  Administered 2014-10-04: 30 mg via INTRAVENOUS

## 2014-10-04 MED ORDER — OXYCODONE HCL 5 MG/5ML PO SOLN
5.0000 mg | Freq: Once | ORAL | Status: DC | PRN
Start: 2014-10-04 — End: 2014-10-04
  Filled 2014-10-04: qty 5

## 2014-10-04 MED ORDER — CIPROFLOXACIN IN D5W 400 MG/200ML IV SOLN
INTRAVENOUS | Status: AC
  Filled 2014-10-04: qty 200

## 2014-10-04 MED ORDER — LIDOCAINE HCL 2 % EX GEL
CUTANEOUS | Status: DC | PRN
  Administered 2014-10-04: 1

## 2014-10-04 MED ORDER — MIDAZOLAM HCL 5 MG/5ML IJ SOLN
INTRAMUSCULAR | Status: DC | PRN
  Administered 2014-10-04: 2 mg via INTRAVENOUS

## 2014-10-04 MED ORDER — FENTANYL CITRATE (PF) 100 MCG/2ML IJ SOLN
INTRAMUSCULAR | Status: DC | PRN
  Administered 2014-10-04: 50 ug via INTRAVENOUS
  Administered 2014-10-04 (×2): 25 ug via INTRAVENOUS

## 2014-10-04 MED ORDER — ACETAMINOPHEN 10 MG/ML IV SOLN
INTRAVENOUS | Status: DC | PRN
  Administered 2014-10-04: 1000 mg via INTRAVENOUS

## 2014-10-04 MED ORDER — LACTATED RINGERS IV SOLN
INTRAVENOUS | Status: DC
  Administered 2014-10-04: 09:00:00 via INTRAVENOUS
  Filled 2014-10-04: qty 1000

## 2014-10-04 MED ORDER — FENTANYL CITRATE (PF) 100 MCG/2ML IJ SOLN
25.0000 ug | INTRAMUSCULAR | Status: DC | PRN
  Filled 2014-10-04: qty 1

## 2014-10-04 MED ORDER — FENTANYL CITRATE (PF) 100 MCG/2ML IJ SOLN
INTRAMUSCULAR | Status: AC
  Filled 2014-10-04: qty 2

## 2014-10-04 MED ORDER — DEXAMETHASONE SODIUM PHOSPHATE 4 MG/ML IJ SOLN
INTRAMUSCULAR | Status: DC | PRN
  Administered 2014-10-04: 10 mg via INTRAVENOUS

## 2014-10-04 MED ORDER — METOCLOPRAMIDE HCL 5 MG/ML IJ SOLN
INTRAMUSCULAR | Status: DC | PRN
  Administered 2014-10-04: 10 mg via INTRAVENOUS

## 2014-10-04 MED ORDER — LIDOCAINE HCL (CARDIAC) 20 MG/ML IV SOLN
INTRAVENOUS | Status: DC | PRN
  Administered 2014-10-04: 60 mg via INTRAVENOUS

## 2014-10-04 MED ORDER — PROPOFOL 10 MG/ML IV BOLUS
INTRAVENOUS | Status: DC | PRN
  Administered 2014-10-04: 200 mg via INTRAVENOUS

## 2014-10-04 MED ORDER — ONDANSETRON HCL 4 MG/2ML IJ SOLN
4.0000 mg | Freq: Four times a day (QID) | INTRAMUSCULAR | Status: DC | PRN
  Filled 2014-10-04: qty 2

## 2014-10-04 MED ORDER — SCOPOLAMINE 1 MG/3DAYS TD PT72
MEDICATED_PATCH | TRANSDERMAL | Status: AC
  Filled 2014-10-04: qty 1

## 2014-10-04 MED ORDER — ONDANSETRON HCL 4 MG/2ML IJ SOLN
INTRAMUSCULAR | Status: DC | PRN
  Administered 2014-10-04: 4 mg via INTRAVENOUS

## 2014-10-04 MED ORDER — OXYCODONE HCL 5 MG PO TABS
5.0000 mg | ORAL_TABLET | Freq: Once | ORAL | Status: DC | PRN
  Filled 2014-10-04: qty 1

## 2014-10-04 MED ORDER — HYDROCODONE-ACETAMINOPHEN 5-325 MG PO TABS: 1.0000 | ORAL_TABLET | Freq: Four times a day (QID) | ORAL | Status: DC | PRN

## 2014-10-04 MED ORDER — SODIUM CHLORIDE 0.9 % IR SOLN
Status: DC | PRN
  Administered 2014-10-04: 3000 mL
  Administered 2014-10-04: 1000 mL

## 2014-10-04 MED ORDER — CIPROFLOXACIN IN D5W 400 MG/200ML IV SOLN
400.0000 mg | INTRAVENOUS | Status: AC
  Administered 2014-10-04: 400 mg via INTRAVENOUS
  Filled 2014-10-04: qty 200

## 2014-10-04 SURGICAL SUPPLY — 36 items
ADAPTER CATH URET PLST 4-6FR (CATHETERS) IMPLANT
BAG DRAIN URO-CYSTO SKYTR STRL (DRAIN) ×2 IMPLANT
BASKET LASER NITINOL 1.9FR (BASKET) IMPLANT
BASKET STNLS GEMINI 4WIRE 3FR (BASKET) IMPLANT
BASKET ZERO TIP NITINOL 2.4FR (BASKET) ×2 IMPLANT
BENZOIN TINCTURE PRP APPL 2/3 (GAUZE/BANDAGES/DRESSINGS) IMPLANT
CANISTER SUCT LVC 12 LTR MEDI- (MISCELLANEOUS) ×2 IMPLANT
CATH INTERMIT  6FR 70CM (CATHETERS) ×2 IMPLANT
CATH URET 5FR 28IN CONE TIP (BALLOONS)
CATH URET 5FR 28IN OPEN ENDED (CATHETERS) IMPLANT
CATH URET 5FR 70CM CONE TIP (BALLOONS) IMPLANT
CLOTH BEACON ORANGE TIMEOUT ST (SAFETY) ×2 IMPLANT
DRSG TEGADERM 2-3/8X2-3/4 SM (GAUZE/BANDAGES/DRESSINGS) IMPLANT
FIBER LASER FLEXIVA 1000 (UROLOGICAL SUPPLIES) IMPLANT
FIBER LASER FLEXIVA 200 (UROLOGICAL SUPPLIES) IMPLANT
FIBER LASER FLEXIVA 365 (UROLOGICAL SUPPLIES) ×2 IMPLANT
FIBER LASER FLEXIVA 550 (UROLOGICAL SUPPLIES) IMPLANT
GLOVE BIO SURGEON STRL SZ7.5 (GLOVE) ×2 IMPLANT
GLOVE BIOGEL PI IND STRL 6.5 (GLOVE) ×2 IMPLANT
GLOVE BIOGEL PI INDICATOR 6.5 (GLOVE) ×2
GOWN STRL REUS W/ TWL XL LVL3 (GOWN DISPOSABLE) ×1 IMPLANT
GOWN STRL REUS W/TWL XL LVL3 (GOWN DISPOSABLE) ×1
GUIDEWIRE 0.038 PTFE COATED (WIRE) IMPLANT
GUIDEWIRE ANG ZIPWIRE 038X150 (WIRE) IMPLANT
GUIDEWIRE STR DUAL SENSOR (WIRE) IMPLANT
IV NS 1000ML (IV SOLUTION) ×1
IV NS 1000ML BAXH (IV SOLUTION) ×1 IMPLANT
IV NS IRRIG 3000ML ARTHROMATIC (IV SOLUTION) ×2 IMPLANT
KIT BALLIN UROMAX 15FX10 (LABEL) IMPLANT
KIT BALLN UROMAX 15FX4 (MISCELLANEOUS) IMPLANT
KIT BALLN UROMAX 26 75X4 (MISCELLANEOUS)
NS IRRIG 500ML POUR BTL (IV SOLUTION) IMPLANT
PACK CYSTO (CUSTOM PROCEDURE TRAY) ×2 IMPLANT
SET HIGH PRES BAL DIL (LABEL)
SHEATH ACCESS URETERAL 38CM (SHEATH) IMPLANT
WATER STERILE IRR 500ML POUR (IV SOLUTION) ×2 IMPLANT

## 2014-10-04 NOTE — Interval H&P Note (Signed)
History and Physical Interval Note:  10/04/2014 10:58 AM  [name]  has presented today for surgery, with the diagnosis of RIGHT URETERAL CALCULI  The various methods of treatment have been discussed with the patient and family. After consideration of risks, benefits and other options for treatment, the patient has consented to  Procedure(s): CYSTOSCOPY WITH RETROGRADE PYELOGRAM, URETEROSCOPY AND POSSIBLE STENT PLACEMENT (Right) HOLMIUM LASER APPLICATION (Right) as a surgical intervention .  The patient's history has been reviewed, patient examined, no change in status, stable for surgery.  I have reviewed the patient's chart and labs.  Questions were answered to the patient's satisfaction.     [name] S

## 2014-10-04 NOTE — Anesthesia Preprocedure Evaluation (Signed)
Anesthesia Evaluation  Patient identified by MRN, date of birth, ID band Patient awake    Reviewed: Allergy & Precautions, NPO status , Patient's Chart, lab work & pertinent test results  History of Anesthesia Complications (+) PONV  Airway Mallampati: II   Neck ROM: full    Dental   Pulmonary neg pulmonary ROS,  breath sounds clear to auscultation        Cardiovascular hypertension, Rhythm:regular Rate:Normal     Neuro/Psych  Headaches,    GI/Hepatic   Endo/Other  obese  Renal/GU      Musculoskeletal  (+) Arthritis -,   Abdominal   Peds  Hematology   Anesthesia Other Findings   Reproductive/Obstetrics                             Anesthesia Physical Anesthesia Plan  ASA: II  Anesthesia Plan: General   Post-op Pain Management:    Induction: Intravenous  Airway Management Planned: LMA  Additional Equipment:   Intra-op Plan:   Post-operative Plan:   Informed Consent: I have reviewed the patients History and Physical, chart, labs and discussed the procedure including the risks, benefits and alternatives for the proposed anesthesia with the patient or authorized representative who has indicated his/her understanding and acceptance.     Plan Discussed with: CRNA, Anesthesiologist and Surgeon  Anesthesia Plan Comments:         Anesthesia Quick Evaluation

## 2014-10-04 NOTE — Anesthesia Procedure Notes (Signed)
Procedure Name: LMA Insertion Date/Time: 10/04/2014 11:23 AM Performed by: Mechele Claude Pre-anesthesia Checklist: Patient identified, Emergency Drugs available, Suction available and Patient being monitored Patient Re-evaluated:Patient Re-evaluated prior to inductionOxygen Delivery Method: Circle System Utilized Preoxygenation: Pre-oxygenation with 100% oxygen Intubation Type: IV induction Ventilation: Mask ventilation without difficulty LMA: LMA inserted LMA Size: 4.0 Number of attempts: 1 Airway Equipment and Method: bite block Placement Confirmation: positive ETCO2 Tube secured with: Tape Dental Injury: Teeth and Oropharynx as per pre-operative assessment

## 2014-10-04 NOTE — Transfer of Care (Signed)
  Filed Vitals:   10/04/14 0903  BP: 138/90  Pulse: 94  Temp: 37.2 C  Resp: 16    Immediate Anesthesia Transfer of Care Note  Patient: [name]  Procedure(s) Performed: Procedure(s) (LRB): CYSTOSCOPY WITH RETROGRADE PYELOGRAM, URETEROSCOPY  (Right) HOLMIUM LASER APPLICATION (Right)  Patient Location: PACU  Anesthesia Type: General  Level of Consciousness: awake, alert  and oriented  Airway & Oxygen Therapy: Patient Spontanous Breathing and Patient connected to nasal cannula oxygen  Post-op Assessment: Report given to PACU RN and Post -op Vital signs reviewed and stable  Post vital signs: Reviewed and stable  Complications: No apparent anesthesia complications

## 2014-10-04 NOTE — Op Note (Signed)
Preoperative diagnosis: Right distal ureteral calculi Postoperative diagnosis: Same  Procedure: Cystoscopy, ureteroscopy with holmium laser lithotripsy, basketing of stone fragments   Surgeon: Bernestine Amass M.D.  Anesthesia: Gen.  Indications: [name] is 49 years of age. She has a long-standing previous history of nephrolithiasis. Approximate 6 a weeks ago she developed some gross hematuria. On follow-up imaging there was a suggestion of some distal ureteral calculi. She subsequently underwent stone protocol CT imaging which revealed several stones in the distal right ureter near the ureterovesical junction not causing any significant obstruction. She has been unable to pass the stones now and presents for definitive intervention. She has had some mild discomfort on the right side.     Technique and findings: Patient was brought to the operating room where she had successful induction of general anesthesia. She was placed in lithotomy position and prepped and draped usual manner. Cystoscopy revealed unremarkable urethra. The right hemitrigone was inflamed and there was evidence of stone material crowning at the right ureteral orifice. There was no need for retrograde pyelography. A guidewire was placed beyond the stones into the right renal pelvis. The right distal ureter was then engaged with a 6 French rigid ureteroscope. Approximately 4 stones were encountered between 3 and 6 mm in size. Holmium laser lithotripter was used to fragment the stones. This was done at 0.8 J 5 Hz. The majority these fragments flushed on the round but 1 require basket extraction. There was no need for ureteral dilation and we thought the patient would do well without double-J stent placement. Her bladder was emptied and she was brought to recovery room in stable condition having had no obvious complications or problems.

## 2014-10-04 NOTE — Discharge Instructions (Signed)
[facility] [phone] Post Ureteroscopy With or Without Stent Instructions  Definitions:  Ureter: The duct that transports urine from the kidney to the bladder. Stent:   A plastic hollow tube that is placed into the ureter, from the kidney to the                 bladder to prevent the ureter from swelling shut.  GENERAL INSTRUCTIONS:  Despite the fact that no skin incisions were used, the area around the ureter and bladder is raw and irritated. The stent is a foreign body which will further irritate the bladder wall. This irritation is manifested by increased frequency of urination, both day and night, and by an increase in the urge to urinate. In some, the urge to urinate is present almost always. Sometimes the urge is strong enough that you may not be able to stop yourself from urinating. The only real cure is to remove the stent and then give time for the bladder wall to heal which can't be done until the danger of the ureter swelling shut has passed, which varies.  You may see some blood in your urine while the stent is in place and a few days afterwards. Do not be alarmed, even if the urine was clear for a while. Get off your feet and drink lots of fluids until clearing occurs. If you start to pass clots or don't improve, call us.  DIET: You may return to your normal diet immediately. Because of the raw surface of your bladder, alcohol, spicy foods, acid type foods and drinks with caffeine may cause irritation or frequency and should be used in moderation. To keep your urine flowing freely and to avoid constipation, drink plenty of fluids during the day ( 8-10 glasses ). Tip: Avoid cranberry juice because it is very acidic.  ACTIVITY: Your physical activity doesn't need to be restricted. However, if you are very active, you may see some blood in your urine. We suggest that you reduce your activity under these circumstances until the bleeding has stopped.  BOWELS: It is  important to keep your bowels regular during the postoperative period. Straining with bowel movements can cause bleeding. A bowel movement every other day is reasonable. Use a mild laxative if needed, such as Milk of Magnesia 2-3 tablespoons, or 2 Dulcolax tablets. Call if you continue to have problems. If you have been taking narcotics for pain, before, during or after your surgery, you may be constipated. Take a laxative if necessary.   MEDICATION: You should resume your pre-surgery medications unless told not to. In addition you will often be given an antibiotic to prevent infection. These should be taken as prescribed until the bottles are finished unless you are having an unusual reaction to one of the drugs.  PROBLEMS YOU SHOULD REPORT TO [place]:  Fevers over 100.5 Fahrenheit.  Heavy bleeding, or clots ( See above notes about blood in urine ).  Inability to urinate.  Drug reactions ( hives, rash, nausea, vomiting, diarrhea ).  Severe burning or pain with urination that is not improving.  FOLLOW-UP: You will need a follow-up appointment to monitor your progress. Call for this appointment at the number listed above. Usually the first appointment will be about three to fourteen days after your surgery.   Post Anesthesia Home Care Instructions  Activity: Get plenty of rest for the remainder of the day. A responsible adult should stay with you for 24 hours following the procedure.  For the next  24 hours, DO NOT: -Drive a car -Paediatric nurse -Drink alcoholic beverages -Take any medication unless instructed by your physician -Make any legal decisions or sign important papers.  Meals: Start with liquid foods such as gelatin or soup. Progress to regular foods as tolerated. Avoid greasy, spicy, heavy foods. If nausea and/or vomiting occur, drink only clear liquids until the nausea and/or vomiting subsides. Call your physician if vomiting continues.  Special Instructions/Symptoms: Your  throat may feel dry or sore from the anesthesia or the breathing tube placed in your throat during surgery. If this causes discomfort, gargle with warm salt water. The discomfort should disappear within 24 hours.  If you had a scopolamine patch placed behind your ear for the management of post- operative nausea and/or vomiting:  1. The medication in the patch is effective for 72 hours, after which it should be removed.  Wrap patch in a tissue and discard in the trash. Wash hands thoroughly with soap and water. 2. You may remove the patch earlier than 72 hours if you experience unpleasant side effects which may include dry mouth, dizziness or visual disturbances. 3. Avoid touching the patch. Wash your hands with soap and water after contact with the patch.   [facility] [phone] Post Ureteroscopy With or Without Stent Instructions  Definitions:  Ureter: The duct that transports urine from the kidney to the bladder. Stent:   A plastic hollow tube that is placed into the ureter, from the kidney to the                 bladder to prevent the ureter from swelling shut.  GENERAL INSTRUCTIONS:  Despite the fact that no skin incisions were used, the area around the ureter and bladder is raw and irritated. The stent is a foreign body which will further irritate the bladder wall. This irritation is manifested by increased frequency of urination, both day and night, and by an increase in the urge to urinate. In some, the urge to urinate is present almost always. Sometimes the urge is strong enough that you may not be able to stop yourself from urinating. The only real cure is to remove the stent and then give time for the bladder wall to heal which can't be done until the danger of the ureter swelling shut has passed, which varies.  You may see some blood in your urine while the stent is in place and a few days afterwards. Do not be alarmed, even if the urine was clear for a while. Get  off your feet and drink lots of fluids until clearing occurs. If you start to pass clots or don't improve, call us.  DIET: You may return to your normal diet immediately. Because of the raw surface of your bladder, alcohol, spicy foods, acid type foods and drinks with caffeine may cause irritation or frequency and should be used in moderation. To keep your urine flowing freely and to avoid constipation, drink plenty of fluids during the day ( 8-10 glasses ). Tip: Avoid cranberry juice because it is very acidic.  ACTIVITY: Your physical activity doesn't need to be restricted. However, if you are very active, you may see some blood in your urine. We suggest that you reduce your activity under these circumstances until the bleeding has stopped.  BOWELS: It is important to keep your bowels regular during the postoperative period. Straining with bowel movements can cause bleeding. A bowel movement every other day is reasonable. Use a mild laxative if needed,  such as Milk of Magnesia 2-3 tablespoons, or 2 Dulcolax tablets. Call if you continue to have problems. If you have been taking narcotics for pain, before, during or after your surgery, you may be constipated. Take a laxative if necessary.   MEDICATION: You should resume your pre-surgery medications unless told not to. In addition you will often be given an antibiotic to prevent infection. These should be taken as prescribed until the bottles are finished unless you are having an unusual reaction to one of the drugs.  PROBLEMS YOU SHOULD REPORT TO [place]:  Fevers over 100.5 Fahrenheit.  Heavy bleeding, or clots ( See above notes about blood in urine ).  Inability to urinate.  Drug reactions ( hives, rash, nausea, vomiting, diarrhea ).  Severe burning or pain with urination that is not improving.  FOLLOW-UP: You will need a follow-up appointment to monitor your progress. Call for this appointment at the number listed above. Usually the first  appointment will be about three to fourteen days after your surgery.

## 2014-10-05 ENCOUNTER — Encounter (HOSPITAL_BASED_OUTPATIENT_CLINIC_OR_DEPARTMENT_OTHER): Payer: Self-pay | Admitting: Urology

## 2014-10-06 NOTE — Anesthesia Postprocedure Evaluation (Signed)
Anesthesia Post Note  Patient: [name]  Procedure(s) Performed: Procedure(s) (LRB): CYSTOSCOPY WITH RETROGRADE PYELOGRAM, URETEROSCOPY  (Right) HOLMIUM LASER APPLICATION (Right)  Anesthesia type: General  Patient location: PACU  Post pain: Pain level controlled and Adequate analgesia  Post assessment: Post-op Vital signs reviewed, Patient's Cardiovascular Status Stable, Respiratory Function Stable, Patent Airway and Pain level controlled  Last Vitals:  Filed Vitals:   10/04/14 1308  BP: 129/77  Pulse: 73  Temp: 36.6 C  Resp: 16    Post vital signs: Reviewed and stable  Level of consciousness: awake, alert  and oriented  Complications: No apparent anesthesia complications

## 2015-07-16 DIAGNOSIS — G43109 Migraine with aura, not intractable, without status migrainosus: Secondary | ICD-10-CM

## 2015-07-16 DIAGNOSIS — E7801 Familial hypercholesterolemia: Secondary | ICD-10-CM

## 2015-07-16 DIAGNOSIS — E78019 Familial hypercholesterolemia, unspecified: Secondary | ICD-10-CM

## 2015-07-16 HISTORY — DX: Migraine with aura, not intractable, without status migrainosus: G43.109

## 2015-07-16 HISTORY — DX: Familial hypercholesterolemia, unspecified: E78.019

## 2015-07-16 HISTORY — DX: Familial hypercholesterolemia: E78.01

## 2017-01-07 ENCOUNTER — Inpatient Hospital Stay (HOSPITAL_COMMUNITY)
Admission: EM | Admit: 2017-01-07 | Discharge: 2017-01-08 | DRG: 316 | Disposition: A | Discharge status: Home / Self Care | Payer: BLUE CROSS/BLUE SHIELD | Attending: Oncology | Admitting: Oncology

## 2017-01-07 ENCOUNTER — Emergency Department (HOSPITAL_COMMUNITY): Payer: BLUE CROSS/BLUE SHIELD

## 2017-01-07 ENCOUNTER — Encounter (HOSPITAL_COMMUNITY): Payer: Self-pay | Admitting: *Deleted

## 2017-01-07 DIAGNOSIS — M2550 Pain in unspecified joint: Secondary | ICD-10-CM

## 2017-01-07 DIAGNOSIS — R072 Precordial pain: Secondary | ICD-10-CM | POA: Diagnosis present

## 2017-01-07 DIAGNOSIS — R700 Elevated erythrocyte sedimentation rate: Secondary | ICD-10-CM | POA: Diagnosis present

## 2017-01-07 DIAGNOSIS — E049 Nontoxic goiter, unspecified: Secondary | ICD-10-CM

## 2017-01-07 DIAGNOSIS — I10 Essential (primary) hypertension: Secondary | ICD-10-CM | POA: Diagnosis present

## 2017-01-07 DIAGNOSIS — I309 Acute pericarditis, unspecified: Secondary | ICD-10-CM | POA: Diagnosis present

## 2017-01-07 DIAGNOSIS — E785 Hyperlipidemia, unspecified: Secondary | ICD-10-CM | POA: Diagnosis not present

## 2017-01-07 DIAGNOSIS — E042 Nontoxic multinodular goiter: Secondary | ICD-10-CM | POA: Diagnosis present

## 2017-01-07 DIAGNOSIS — Z79899 Other long term (current) drug therapy: Secondary | ICD-10-CM

## 2017-01-07 DIAGNOSIS — I5032 Chronic diastolic (congestive) heart failure: Secondary | ICD-10-CM

## 2017-01-07 DIAGNOSIS — E7801 Familial hypercholesterolemia: Secondary | ICD-10-CM | POA: Diagnosis present

## 2017-01-07 DIAGNOSIS — R079 Chest pain, unspecified: Secondary | ICD-10-CM | POA: Diagnosis not present

## 2017-01-07 DIAGNOSIS — D3501 Benign neoplasm of right adrenal gland: Secondary | ICD-10-CM

## 2017-01-07 DIAGNOSIS — I2510 Atherosclerotic heart disease of native coronary artery without angina pectoris: Secondary | ICD-10-CM

## 2017-01-07 DIAGNOSIS — Z8249 Family history of ischemic heart disease and other diseases of the circulatory system: Secondary | ICD-10-CM | POA: Diagnosis not present

## 2017-01-07 DIAGNOSIS — G43109 Migraine with aura, not intractable, without status migrainosus: Secondary | ICD-10-CM | POA: Diagnosis present

## 2017-01-07 DIAGNOSIS — R9431 Abnormal electrocardiogram [ECG] [EKG]: Secondary | ICD-10-CM

## 2017-01-07 DIAGNOSIS — M791 Myalgia: Secondary | ICD-10-CM | POA: Diagnosis present

## 2017-01-07 LAB — ECHOCARDIOGRAM COMPLETE

## 2017-01-07 LAB — TSH: TSH: 2.396 u[IU]/mL (ref 0.350–4.500)

## 2017-01-07 LAB — HEPATIC FUNCTION PANEL
ALBUMIN: 3.4 g/dL — AB (ref 3.5–5.0)
ALT: 19 U/L (ref 14–54)
AST: 26 U/L (ref 15–41)
Alkaline Phosphatase: 104 U/L (ref 38–126)
BILIRUBIN TOTAL: 0.6 mg/dL (ref 0.3–1.2)
Bilirubin, Direct: <0.1 mg/dL — ABNORMAL LOW (ref 0.1–0.5)
TOTAL PROTEIN: 7 g/dL (ref 6.5–8.1)

## 2017-01-07 LAB — CBC WITH DIFFERENTIAL/PLATELET
BASOS ABS: 0 10*3/uL (ref 0.0–0.1)
Basophils Relative: 0 %
Eosinophils Absolute: 0.1 10*3/uL (ref 0.0–0.7)
Eosinophils Relative: 1 %
HEMATOCRIT: 39.8 % (ref 36.0–46.0)
HEMOGLOBIN: 13 g/dL (ref 12.0–15.0)
LYMPHS PCT: 13 %
Lymphs Abs: 1.8 10*3/uL (ref 0.7–4.0)
MCH: 26.4 pg (ref 26.0–34.0)
MCHC: 32.7 g/dL (ref 30.0–36.0)
MCV: 80.7 fL (ref 78.0–100.0)
Monocytes Absolute: 1.2 10*3/uL — ABNORMAL HIGH (ref 0.1–1.0)
Monocytes Relative: 9 %
NEUTROS ABS: 11 10*3/uL — AB (ref 1.7–7.7)
NEUTROS PCT: 77 %
Platelets: 204 10*3/uL (ref 150–400)
RBC: 4.93 MIL/uL (ref 3.87–5.11)
RDW: 13.8 % (ref 11.5–15.5)
WBC: 14.2 10*3/uL — AB (ref 4.0–10.5)

## 2017-01-07 LAB — BASIC METABOLIC PANEL
Anion gap: 9 (ref 5–15)
BUN: 15 mg/dL (ref 6–20)
CALCIUM: 8.3 mg/dL — AB (ref 8.9–10.3)
CO2: 22 mmol/L (ref 22–32)
CREATININE: 0.62 mg/dL (ref 0.44–1.00)
Chloride: 104 mmol/L (ref 101–111)
GFR calc Af Amer: >60 mL/min (ref >60)
Glucose, Bld: 127 mg/dL — ABNORMAL HIGH (ref 65–99)
Potassium: 3.4 mmol/L — ABNORMAL LOW (ref 3.5–5.1)
Sodium: 135 mmol/L (ref 135–145)

## 2017-01-07 LAB — SEDIMENTATION RATE: SED RATE: 55 mm/h — AB (ref 0–22)

## 2017-01-07 LAB — I-STAT TROPONIN, ED
TROPONIN I, POC: 0.03 ng/mL (ref 0.00–0.08)
TROPONIN I, POC: 0.06 ng/mL (ref 0.00–0.08)

## 2017-01-07 LAB — TROPONIN I
TROPONIN I: 0.06 ng/mL — AB (ref <0.03)
TROPONIN I: 0.04 ng/mL — AB (ref <0.03)

## 2017-01-07 LAB — D-DIMER, QUANTITATIVE (NOT AT ARMC): D DIMER QUANT: 0.45 ug{FEU}/mL (ref 0.00–0.50)

## 2017-01-07 LAB — MAGNESIUM: Magnesium: 2.1 mg/dL (ref 1.7–2.4)

## 2017-01-07 LAB — C-REACTIVE PROTEIN: CRP: 5.1 mg/dL — AB (ref <1.0)

## 2017-01-07 MED ORDER — MORPHINE SULFATE (PF) 4 MG/ML IV SOLN
4.0000 mg | Freq: Once | INTRAVENOUS | Status: AC
  Administered 2017-01-07: 4 mg via INTRAVENOUS
  Filled 2017-01-07: qty 1

## 2017-01-07 MED ORDER — KETOROLAC TROMETHAMINE 30 MG/ML IJ SOLN
30.0000 mg | Freq: Once | INTRAMUSCULAR | Status: AC
  Administered 2017-01-07: 30 mg via INTRAVENOUS
  Filled 2017-01-07: qty 1

## 2017-01-07 MED ORDER — COLCHICINE 0.6 MG PO TABS
0.6000 mg | ORAL_TABLET | Freq: Two times a day (BID) | ORAL | Status: DC
  Administered 2017-01-07: 0.6 mg via ORAL
  Filled 2017-01-07 (×2): qty 1

## 2017-01-07 MED ORDER — OXYCODONE-ACETAMINOPHEN 5-325 MG PO TABS
1.0000 | ORAL_TABLET | Freq: Once | ORAL | Status: AC
  Administered 2017-01-07: 1 via ORAL
  Filled 2017-01-07: qty 1

## 2017-01-07 MED ORDER — IBUPROFEN 800 MG PO TABS
800.0000 mg | ORAL_TABLET | Freq: Three times a day (TID) | ORAL | Status: DC
  Administered 2017-01-07: 800 mg via ORAL
  Filled 2017-01-07 (×2): qty 1

## 2017-01-07 MED ORDER — IOPAMIDOL (ISOVUE-370) INJECTION 76%
INTRAVENOUS | Status: AC
  Administered 2017-01-07: 100 mL
  Filled 2017-01-07: qty 100

## 2017-01-07 MED ORDER — SODIUM CHLORIDE 0.9% FLUSH
3.0000 mL | Freq: Two times a day (BID) | INTRAVENOUS | Status: DC
  Administered 2017-01-07: 3 mL via INTRAVENOUS

## 2017-01-07 MED ORDER — ACETAMINOPHEN 650 MG RE SUPP: 650.0000 mg | Freq: Four times a day (QID) | RECTAL | Status: DC | PRN

## 2017-01-07 MED ORDER — SODIUM CHLORIDE 0.9 % IV BOLUS (SEPSIS)
1000.0000 mL | Freq: Once | INTRAVENOUS | Status: AC
  Administered 2017-01-07: 1000 mL via INTRAVENOUS

## 2017-01-07 MED ORDER — KETOROLAC TROMETHAMINE 15 MG/ML IJ SOLN
15.0000 mg | Freq: Once | INTRAMUSCULAR | Status: AC
  Administered 2017-01-07: 15 mg via INTRAVENOUS
  Filled 2017-01-07: qty 1

## 2017-01-07 MED ORDER — ENOXAPARIN SODIUM 40 MG/0.4ML ~~LOC~~ SOLN
40.0000 mg | SUBCUTANEOUS | Status: DC
  Administered 2017-01-07: 40 mg via SUBCUTANEOUS
  Filled 2017-01-07: qty 0.4

## 2017-01-07 MED ORDER — ACETAMINOPHEN 325 MG PO TABS
650.0000 mg | ORAL_TABLET | Freq: Four times a day (QID) | ORAL | Status: DC | PRN
  Administered 2017-01-08: 650 mg via ORAL
  Filled 2017-01-07: qty 2

## 2017-01-07 MED ORDER — POTASSIUM CHLORIDE CRYS ER 20 MEQ PO TBCR
40.0000 meq | EXTENDED_RELEASE_TABLET | Freq: Once | ORAL | Status: AC
  Administered 2017-01-07: 40 meq via ORAL
  Filled 2017-01-07: qty 2

## 2017-01-07 MED ORDER — POLYETHYLENE GLYCOL 3350 17 G PO PACK: 17.0000 g | PACK | Freq: Every day | ORAL | Status: DC | PRN

## 2017-01-07 MED ORDER — ROSUVASTATIN CALCIUM 10 MG PO TABS
20.0000 mg | ORAL_TABLET | Freq: Every evening | ORAL | Status: DC
  Administered 2017-01-07: 20 mg via ORAL
  Filled 2017-01-07: qty 2

## 2017-01-07 MED ORDER — DULOXETINE HCL 30 MG PO CPEP
30.0000 mg | ORAL_CAPSULE | Freq: Every day | ORAL | Status: DC
  Filled 2017-01-07: qty 1

## 2017-01-07 NOTE — ED Notes (Signed)
Patient returned from CT

## 2017-01-07 NOTE — Progress Notes (Signed)
  Echocardiogram 2D Echocardiogram has been performed.  Jennette Dubin 01/07/2017, 1:57 PM

## 2017-01-07 NOTE — ED Provider Notes (Addendum)
Signed out by Dr Dina Rich to check CT when back.  No PE or pna on cxr. bp normal. Afeb.   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  Pt indicates pcp had checked thyroid fxn recently, denies hx thyroid abn - will have f/u pcp for ct findings.   Repeat trop .06.  Pain improved from prior, but persists.   ecg repeated.  On ecgs from today, ?sl st elev II and v6, vs pr depression - showed ecgs to cardiology, Dr Burt Knack - he questions whether possibly more reflective of pericarditis.  Given recurrent chest pain, sob, persistent tachycardia,  signif fam hx cad, will consult med service for admission. Serial trop.        Lajean Saver, MD 01/07/17 1144

## 2017-01-07 NOTE — ED Notes (Signed)
Patient transported to CT 

## 2017-01-07 NOTE — ED Notes (Signed)
Attempted report 

## 2017-01-07 NOTE — ED Notes (Signed)
Patient given something to drink per EDP approval  

## 2017-01-07 NOTE — ED Provider Notes (Addendum)
[facility] DEPT Provider Note   CSN: [id number] Arrival date & time: 01/07/17  2993  By signing my name below, I, Ny'Kea Lewis, attest that this documentation has been prepared under the direction and in the presence of Horton, Barbette Hair, MD. Electronically Signed: Lise Auer, ED Scribe. 01/07/17. 4:12 AM.   History   Chief Complaint Chief Complaint  Patient presents with  . Chest Pain    The history is provided by the patient. No language interpreter was used.   HPI HPI Comments: [name] is a 51 y.o. female with a PMhx of DJD, HLD, and HTN, brought in by ambulance, who presents to the Emergency Department complaining of sudden onset, intermittent chest pain that begn yesterday around 4:30 pm. She notes associated mild cough. Pt reports she was at a golf tournament when she began to experience sharp centralized chest pain that radiates in the left shoulder. She reports it immediately resolved but it returned around 8 pm tonight and she took two ASA. Pt states she was uncomfortable but eventually she fell asleep and woke up to the same pain. She took two more ASA and tried to lay around but the pain would not resolve. Pt states her pain is exacerbated with deep breathing. PFHx of heart disease in late 38 s. No h/o PE/DVT or recent long travel. Denies alcohol or tobacco usage. She denies abdominal pain, leg swelling, diarrhea, nausea, vomiting or fever.     Past Medical History:  Diagnosis Date  . DJD (degenerative joint disease)    knees and ankles  . Hematuria   . History of endometriosis   . History of kidney stones   . Hyperlipidemia   . Hypertension   . Migraines   . PONV (postoperative nausea and vomiting)   . Right ureteral stone   . Wears contact lenses     Patient Active Problem List   Diagnosis Date Noted  . Ureteral calculi 10/04/2014    Past Surgical History:  Procedure Laterality Date  . CYSTOSCOPY WITH RETROGRADE PYELOGRAM, URETEROSCOPY AND STENT  PLACEMENT Right 10/04/2014   Procedure: CYSTOSCOPY WITH RETROGRADE PYELOGRAM, URETEROSCOPY ;  Surgeon: Rana Snare, MD;  Location: [facility];  Service: Urology;  Laterality: Right;  . DILATION AND EVACUATION  1993  . HOLMIUM LASER APPLICATION Right 12/22/9676   Procedure: HOLMIUM LASER APPLICATION;  Surgeon: Rana Snare, MD;  Location: [facility];  Service: Urology;  Laterality: Right;  . LAPAROSCOPY LEFT OVARIAN CYSTECTOMY  06-17-2007   and Partial Left Salpingectomy  . LAPAROSCOPY RIGHT TUBAL LIGATION/  REMOVAL IUD/  HYSTEROSCOPY GYNECARE ENDOMETRIAL BALLOON ABLATION  07-14-2003  . RIGHT URETEROSCPIC LASER LITHOTRIPSY STONE EXTRACTION  02-15-2007   OB History    No data available      Home Medications    Prior to Admission medications   Medication Sig Start Date End Date Taking? Authorizing Provider  amLODipine (NORVASC) 5 MG tablet Take 5 mg by mouth every evening.    Yes [provider]  DULoxetine (CYMBALTA) 30 MG capsule Take 30 mg by mouth daily. 01/05/17  Yes [provider]  rosuvastatin (CRESTOR) 20 MG tablet Take 20 mg by mouth every evening. 12/25/16  Yes [provider]  zolmitriptan (ZOMIG) 5 MG tablet Take 5 mg by mouth as needed for migraine.   Yes [provider]  HYDROcodone-acetaminophen (NORCO/VICODIN) 5-325 MG per tablet Take 1 tablet by mouth every 6 (six) hours as needed for moderate pain. Patient not taking: Reported on  01/07/2017 10/04/14   Rana Snare, MD   Family History History reviewed. No pertinent family history.  Social History Social History  Substance Use Topics  . Smoking status: Never Smoker  . Smokeless tobacco: Never Used  . Alcohol use No    Allergies   Cephalosporins and Penicillins   Review of Systems Review of Systems  Constitutional: Negative for fever.  Respiratory: Positive for cough.   Cardiovascular: Positive for chest pain. Negative for leg swelling.    Gastrointestinal: Negative for abdominal pain, diarrhea, nausea and vomiting.   Physical Exam Updated Vital Signs BP 111/72 (BP Location: Right Arm)   Pulse (!) 105   Temp 98.1 F (36.7 C) (Oral)   Resp 16   LMP 04/09/2012   SpO2 92%   Physical Exam  Constitutional: She is oriented to person, place, and time. She appears well-developed and well-nourished. No distress.  HENT:  Head: Normocephalic and atraumatic.  Cardiovascular: Regular rhythm and normal heart sounds.   Tachycardia  Pulmonary/Chest: Effort normal. No respiratory distress. She has no wheezes. She exhibits no tenderness.  Abdominal: Soft. Bowel sounds are normal. There is no tenderness. There is no guarding.  Musculoskeletal: She exhibits no edema.  No tenderness to palpation of bilateral calves, no asymmetric swelling  Neurological: She is alert and oriented to person, place, and time.  Skin: Skin is warm and dry.  Psychiatric: She has a normal mood and affect.  Nursing note and vitals reviewed.    ED Treatments / Results  DIAGNOSTIC STUDIES: Oxygen Saturation is 95% on RA, adequate by my interpretation.   COORDINATION OF CARE: 3:56 AM-Discussed next steps with pt. Pt verbalized understanding and is agreeable with the plan.   Labs (all labs ordered are listed, but only abnormal results are displayed) Labs Reviewed  CBC WITH DIFFERENTIAL/PLATELET - Abnormal; Notable for the following:       Result Value   WBC 14.2 (*)    Neutro Abs 11.0 (*)    Monocytes Absolute 1.2 (*)    All other components within normal limits  BASIC METABOLIC PANEL - Abnormal; Notable for the following:    Potassium 3.4 (*)    Glucose, Bld 127 (*)    Calcium 8.3 (*)    All other components within normal limits  D-DIMER, QUANTITATIVE (NOT AT [facility])  I-STAT TROPONIN, ED    EKG  EKG Interpretation  Date/Time:  Wednesday January 07 2017 03:23:49 EDT Ventricular Rate:  116 PR Interval:    QRS Duration: 78 QT  Interval:  324 QTC Calculation: 451 R Axis:   25 Text Interpretation:  Sinus tachycardia Confirmed by Thayer Jew [phone]) on 01/07/2017 4:19:13 AM       Radiology Dg Chest 2 View  Result Date: 01/07/2017 CLINICAL DATA:  Left-sided chest pain. EXAM: CHEST  2 VIEW COMPARISON:  No recent. FINDINGS: Mediastinum hilar structures normal. Low lung volumes with mild basilar atelectasis. No pleural effusion or pneumothorax. Heart size normal. No acute bony abnormality. IMPRESSION: Low lung volumes with mild basilar atelectasis, otherwise negative chest. Electronically Signed   By: [place]   On: 01/07/2017 07:00    Procedures Procedures (including critical care time)  Medications Ordered in ED Medications  oxyCODONE-acetaminophen (PERCOCET/ROXICET) 5-325 MG per tablet 1 tablet (1 tablet Oral Given 01/07/17 0445)  sodium chloride 0.9 % bolus 1,000 mL (1,000 mLs Intravenous New Bag/Given 01/07/17 0551)  morphine 4 MG/ML injection 4 mg (4 mg Intravenous Given 01/07/17 0551)  ketorolac (TORADOL) 15 MG/ML injection 15 mg (  15 mg Intravenous Given 01/07/17 0551)     Initial Impression / Assessment and Plan / ED Course  I have reviewed the triage vital signs and the nursing notes.  Pertinent labs & imaging results that were available during my care of the patient were reviewed by me and considered in my medical decision making (see chart for details).     Patient presents with worsening chest pain over the last 12 hours. Pleuritic in nature. She is nontoxic-appearing. Vital signs notable for heart rate 111. Initial O2 sat 95% with repeats trending downward. She is fairly low risk for PE. D-dimer was sent. Chest x-ray, EKG, troponin all obtained and largely reassuring. D-dimer was 0.48. On recheck, patient reports persistent, intense pleuritic chest pain. She remains tachycardic in the 110s. Patient was given normal saline and IV pain medication.    7:20 AM After a liter of fluids, heart rate  105, blood pressures now trending downwards to 854 systolic. She has continued pain. While d-dimer is fairly sensitive, I'm concerned regarding patient's vital signs and continued pain.  For this reason, I discussed the risk and benefits of further imaging with CTA. Patient is amenable to further testing. This will also give a better visualization of the lung parenchyma.  8:19 AM On recheck, heart rate continues to be mildly tachycardic. Blood pressure 62-703 systolic. Reports history of hypertension. Second liter of fluid ordered. She does have a mild leukocytosis. She is awaiting going to CT scan. She is otherwise well-appearing.  Pneumonia that is not showing up on x-rays a potential etiology given lab work.  Signed out to Dr. Ashok Cordia  Final Clinical Impressions(s) / ED Diagnoses   Final diagnoses:  None    New Prescriptions New Prescriptions   No medications on file   I personally performed the services described in this documentation, which was scribed in my presence. The recorded information has been reviewed and is accurate.     Merryl Hacker, MD 01/07/17 5009    Merryl Hacker, MD 01/07/17 [phone]

## 2017-01-07 NOTE — ED Triage Notes (Signed)
Patient presents to ed via [facility] states she was at a golf tournament yest and started having chest pain lasted approx. 30 mins went home around 830 chest pain returned took a shower and 1 adult ASA states she went to bed was able to sleep for a while denies n/v. Stats she was awaken by the chest pain at 1am took 2 adult asa , describes as pressure worse with deep breath. Lungs clear. C/o dry cough. Patient was given ntg x 3 and 324 mg asa by ems.

## 2017-01-07 NOTE — H&P (Signed)
Date: 01/07/2017               Patient Name:  [name] MRN: [id number]  DOB: Dec 02, 1965 Age / Sex: 51 y.o., female   PCP: Chesley Noon, MD         Medical Service: Internal Medicine Teaching Service         Attending Physician: Dr. Annia Belt, MD    First Contact: Dr. Aggie Hacker Pager: [phone]  Second Contact: Dr. Juleen China Pager: [phone]       After Hours (After 5p/  First Contact Pager: [phone]  weekends / holidays): Second Contact Pager: [phone]   Chief Complaint: Chest pain  History of Present Illness:  51 yo female with PMHx of HTN, HLD, Migraines, DJD, and epislceritis presenting with a chief complaint of chest pain. The pain started yesterday evening around 4:30PM. 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 She describes the pain as tender and sore. She says the pain was intially diffuse across the front of her chest, non-radiating, 7/10 in severity, with associated shortness of breath. The first episode lasted for 25-35 minutes, and eventually subsided. Later that evening she states she became uncomfortable again and took two aspirins and was able to fall asleep. The pain woke her up from sleep around 1 AM. At that time, she states the pain was localized to the left side of her chest, radiated down her left arm, and she rated the pain a 10/10 in severity. She complains of pain on inspiration localized only to her left side. She was given two sublingual nitroglycerins, which helped alleviate her pain. She has never had this type of pain before.  She denies fever or recent infection, but states she started having a dry cough yesterday. She also states that about 4 days prior to admission she had several episodes of nausea accompanied by vomiting.  She denies a history of reflux or dysphagia. She also states that several days prior to admission she had 3 continuous days of migraine with aura. She also  complains of generalized fatigue and lack of energy. She denies new rashes or joint pain/swelling.  When at the beside she stated that she her chest pain had improved. She said her pain continued to be localized to the left side and was now a 1/10 in severity.   In the ED, vitals BP 111/72, pulse 105,  temp 98.1 F, Resp 16, sat 95%. She was given two doses of Toradol (15 mg, '30mg'$ ), morphine, and a Percocet tablet for pain. She receiving 1 L NS, she remained tachycardic into the 110s so CTA was done due to concern for PE. Received an additional 1 L of fluids. CTA was negative for PE or other acute findings.    Meds:  Current Meds  Medication Sig  . amLODipine (NORVASC) 5 MG tablet Take 5 mg by mouth every evening.   . DULoxetine (CYMBALTA) 30 MG capsule Take 30 mg by mouth daily.  . rosuvastatin (CRESTOR) 20 MG tablet Take 20 mg by mouth every evening.  . zolmitriptan (ZOMIG) 5 MG tablet Take 5 mg by mouth as needed for migraine.     Allergies: Allergies as of 01/07/2017 - Review Complete 01/07/2017  Allergen Reaction Noted  . Cephalosporins Hives 09/20/2012  . Penicillins Hives 09/20/2012   Past Medical History:  Diagnosis Date  . DJD (degenerative joint disease)    knees and ankles  .  Hematuria   . History of endometriosis   . History of kidney stones   . Hyperlipidemia   . Hypertension   . Migraines   . PONV (postoperative nausea and vomiting)   . Right ureteral stone   . Wears contact lenses     Family History:  Mother: No cardiac history Father: Hx of first MI in 5s, had 4 stents placed per patient, deceased  PGF: Hx of MI   Social History: Never smoked, denies alcohol use, denies illicit drug use  Review of Systems: A complete ROS was negative except as per HPI.   Physical Exam: Blood pressure 114/67, pulse (!) 103, temperature 98.1 F (36.7 C), temperature source Oral, resp. rate 20, last menstrual period 04/09/2012, SpO2 94 %. Physical Exam    Constitutional: She is oriented to person, place, and time. She appears well-developed and well-nourished. No distress.  HENT:  Head: Normocephalic and atraumatic.  Eyes: Pupils are equal, round, and reactive to light. Conjunctivae and EOM are normal.  Neck: Normal range of motion. Neck supple.  Cardiovascular: Regular rhythm and normal heart sounds.  Tachycardia present.   Pulmonary/Chest: Effort normal and breath sounds normal. No respiratory distress. She has no wheezes. She exhibits tenderness (Reproducable chest tenderness on left side only.).  Abdominal: Soft. Bowel sounds are normal. She exhibits no distension. There is no tenderness.  Musculoskeletal: Normal range of motion. She exhibits no edema or tenderness.  Neurological: She is alert and oriented to person, place, and time.  Skin: Skin is warm and dry.  Psychiatric: She has a normal mood and affect.    EKG: personally reviewed my interpretation is sinus tachycardia, with borderline ST elevations in leads II and V6.  CXR: personally reviewed my interpretation is mild bibasilar atelectasis, otherwise negative.  Assessment & Plan by Problem: Active Problems:   Chest pain Currently, unclear etiology of patients chest pain. Differential includes MI vs Pericarditis vs pneumonia. The patient has been afebrile, but tachycardic into 110s and has a mild leukocytosis of 14.2. First POC Troponin 0.03 -->0.0, EKG had borderline ST elevation in lateral leads, and the patient has a strong family history of CAD. CTA showed no evidence of PE. CXR and CT showed no evidence of pneumonia, but will not rule out entirely due to elevated WBC and pleuritic chest pain. Pericarditis typically presents as a sharp, pleuritic chest pain sometimes preceded by upper respiratory infection or gastrointestinal symptoms, which the patient reports both cough and N/V prior to presentation.  -Trend Troponins q6 hours  -ECHO completed, follow up results -ESR and CRP  ordered  -Considering Pericarditis treatment with NSAIDs and Colchicine   HTN, HLD -Amlodipine 5 mg once daily  -Rosuvastatin 20 mg daily  Polyarthralgia, myalgia Patient had been previously worked up for autoimmune etiology in 10/2016. All lab work completed at that time came back negative. Her rheumatologist started her on cymbalta -Duloxetine 30 mg once daily    Dispo: Admit patient to Inpatient with expected length of stay greater than 2 midnights.  Signed: Melanee Spry, MD 01/07/2017, 2:45 PM  Pager: [phone]

## 2017-01-08 ENCOUNTER — Inpatient Hospital Stay (HOSPITAL_COMMUNITY): Payer: BLUE CROSS/BLUE SHIELD

## 2017-01-08 ENCOUNTER — Encounter (HOSPITAL_COMMUNITY): Payer: Self-pay | Admitting: Physician Assistant

## 2017-01-08 ENCOUNTER — Telehealth: Payer: Self-pay | Admitting: Cardiology

## 2017-01-08 DIAGNOSIS — E7801 Familial hypercholesterolemia: Secondary | ICD-10-CM

## 2017-01-08 DIAGNOSIS — R079 Chest pain, unspecified: Secondary | ICD-10-CM

## 2017-01-08 DIAGNOSIS — R700 Elevated erythrocyte sedimentation rate: Secondary | ICD-10-CM

## 2017-01-08 DIAGNOSIS — I10 Essential (primary) hypertension: Secondary | ICD-10-CM

## 2017-01-08 LAB — BASIC METABOLIC PANEL
Anion gap: 5 (ref 5–15)
BUN: 7 mg/dL (ref 6–20)
CO2: 25 mmol/L (ref 22–32)
CREATININE: 0.61 mg/dL (ref 0.44–1.00)
Calcium: 8.1 mg/dL — ABNORMAL LOW (ref 8.9–10.3)
Chloride: 109 mmol/L (ref 101–111)
GFR calc Af Amer: >60 mL/min (ref >60)
GLUCOSE: 93 mg/dL (ref 65–99)
Potassium: 3.6 mmol/L (ref 3.5–5.1)
SODIUM: 139 mmol/L (ref 135–145)

## 2017-01-08 LAB — CBC
HCT: 35.8 % — ABNORMAL LOW (ref 36.0–46.0)
Hemoglobin: 11.5 g/dL — ABNORMAL LOW (ref 12.0–15.0)
MCH: 26.1 pg (ref 26.0–34.0)
MCHC: 32.1 g/dL (ref 30.0–36.0)
MCV: 81.4 fL (ref 78.0–100.0)
PLATELETS: 202 10*3/uL (ref 150–400)
RBC: 4.4 MIL/uL (ref 3.87–5.11)
RDW: 14.1 % (ref 11.5–15.5)
WBC: 9 10*3/uL (ref 4.0–10.5)

## 2017-01-08 LAB — HIV ANTIBODY (ROUTINE TESTING W REFLEX): HIV SCREEN 4TH GENERATION: NONREACTIVE

## 2017-01-08 LAB — NM MYOCAR MULTI W/SPECT W/WALL MOTION / EF
CHL CUP MPHR: 168 {beats}/min
CSEPHR: 89 %
Peak HR: 151 {beats}/min
Rest HR: 101 {beats}/min

## 2017-01-08 LAB — TROPONIN I: TROPONIN I: 0.04 ng/mL — AB (ref <0.03)

## 2017-01-08 MED ORDER — REGADENOSON 0.4 MG/5ML IV SOLN
0.4000 mg | Freq: Once | INTRAVENOUS | Status: AC
  Administered 2017-01-08: 0.4 mg via INTRAVENOUS
  Filled 2017-01-08: qty 5

## 2017-01-08 MED ORDER — TECHNETIUM TC 99M TETROFOSMIN IV KIT
10.0000 | PACK | Freq: Once | INTRAVENOUS | Status: AC | PRN
  Administered 2017-01-08: 10 via INTRAVENOUS

## 2017-01-08 MED ORDER — TECHNETIUM TC 99M TETROFOSMIN IV KIT
30.0000 | PACK | Freq: Once | INTRAVENOUS | Status: AC | PRN
  Administered 2017-01-08: 30 via INTRAVENOUS

## 2017-01-08 MED ORDER — COLCHICINE 0.6 MG PO TABS: 0.6000 mg | ORAL_TABLET | Freq: Two times a day (BID) | ORAL | 3 refills | Status: DC

## 2017-01-08 MED ORDER — OMEPRAZOLE 40 MG PO CPDR: 40.0000 mg | DELAYED_RELEASE_CAPSULE | Freq: Every day | ORAL | 1 refills | Status: DC

## 2017-01-08 MED ORDER — IBUPROFEN 800 MG PO TABS: 800.0000 mg | ORAL_TABLET | Freq: Three times a day (TID) | ORAL | 0 refills | Status: DC

## 2017-01-08 MED ORDER — REGADENOSON 0.4 MG/5ML IV SOLN
INTRAVENOUS | Status: AC
  Filled 2017-01-08: qty 5

## 2017-01-08 NOTE — Progress Notes (Signed)
   Subjective: No acute events overnight. Patient states that her chest pain has improved significantly since yesterday. She also is no longer experiencing pain on inspiration. She denies fevers, nausea, vomiting, or shortness of breath. She was agreeable to getting discharged later this afternoon.  Objective:  Vital signs in last 24 hours: Vitals:   01/07/17 2044 01/07/17 2200 01/07/17 2216 01/08/17 0533  BP: (!) 134/91 121/67  136/74  Pulse: (!) 111 (!) 117  92  Resp: _0 Temp:  99.2 F (37.3 C)  98.2 F (36.8 C)  TempSrc:  Oral  Oral  SpO2: 94% 96%  96%  Weight:   183 lb 8 oz (83.2 kg) 183 lb 6.4 oz (83.2 kg)  Height:   5' (1.524 m)    Physical Exam  Constitutional: She is oriented to person, place, and time. She appears well-developed and well-nourished. No distress.  HENT:  Head: Normocephalic and atraumatic.  Eyes: Conjunctivae and EOM are normal.  Neck: Normal range of motion. Neck supple.  Cardiovascular: Normal rate, regular rhythm, normal heart sounds and intact distal pulses.  Exam reveals no friction rub.   Pulmonary/Chest: Effort normal and breath sounds normal. No respiratory distress. She exhibits tenderness (Left sided.).  Abdominal: Soft. Bowel sounds are normal. She exhibits no distension. There is no tenderness.  Musculoskeletal: Normal range of motion. She exhibits no edema.  Neurological: She is alert and oriented to person, place, and time.  Skin: Skin is warm and dry.  Psychiatric: She has a normal mood and affect.    Assessment/Plan:  Active Problems:   Chest pain   Essential hypertension   Familial hypercholesterolemia   Migraine with aura and without status migrainosus, not intractable  Chest pain Chest pain secondary to acute pericarditis. Troponin trend 0.06-->0.04-->0.04 which can be mildly elevated in pericarditis.EKG had borderline ST elevation in leads I, II, and V6 with pr depression, which is consistent with pericarditis. CTA showed  no evidence of PE. CXR and CT showed no evidence of pneumonia or acute process. ECHO was completed and showed normal LV size with EF 60-65%. Normal RV size and systolic function, with no significant valvular abnormalities. She was started on Colchicine 0.6 mg BID and Ibuprofen 800 mg TID, which she has responded to with significant improvement in pain. ESR and CRP  elevated, 55 and 5.1, which are typically elevated in acute pericarditis.  -Cardiology to round on patient today, appreciate recs -Plan to discharge home today with Colchicine and Ibuprofen   HTN, HLD -Continue Amlodipine 5 mg once daily  -Continue Rosuvastatin 20 mg daily  Polyarthralgia, myalgia Patient had been previously worked up for autoimmune etiology in 10/2016. All lab work completed at that time came back negative. Her rheumatologist started her on Cymbalta, which she has responded too well.  -Duloxetine 30 mg once daily  Migraines with aura -Tylenol 650 mg PRN  Multinodular goiter Asymptomatic, incidentally found on CT imaging. TSH 2.369.  -Follow up with PCP   DVT PPx: Lovenox  Dispo: Anticipated discharge today.   Melanee Spry, MD 01/08/2017, 10:02 AM Pager: [phone]

## 2017-01-08 NOTE — Discharge Instructions (Addendum)
Treatment plan: - Continue ibuprofen for 10 days (scheduled, NOT just as-needed). If you are still having symptoms at your follow-up appointment, we may need to extend this. Take this with food. If you notice any stomach upset or signs of gastrointestinal bleeding such as blood in stool or black stools, stop this medicine and call your doctor immediately - Take omeprazole to protect your stomach while taking ibuprofen. - Continue colchicine for 3 months.

## 2017-01-08 NOTE — Progress Notes (Signed)
Nuclear stress test was without reversible ischemia. We will plan to treat for suspected pericarditis.   Treatment plan: - Continue ibuprofen for 10 days (scheduled, NOT just as-needed). If she is still having symptoms at her follow-up appointment, we may need to extend this. She should take this with food. If she notices any stomach upset or signs of gastrointestinal bleeding such as blood in stool or black stools, stop this medicine and call her doctor immediately - Prescribe omeprazole to protect her stomach while taking ibuprofen. - Continue colchicine for 3 months.  I will arrange follow up for her.    Tami Lin Jamerson Vonbargen, PA-C 01/08/2017, 4:23 PM [phone] [facility]

## 2017-01-08 NOTE — Telephone Encounter (Signed)
Pt still inpatient at this time.  Triage to follow-up with the pt for TCM call on 8/3

## 2017-01-08 NOTE — Progress Notes (Signed)
   [name] presented for a nuclear stress test today.  No immediate complications.  Stress imaging is pending at this time.  Preliminary EKG findings may be listed in the chart, but the stress test result will not be finalized until perfusion imaging is complete.  Tami Lin Duke, PA-C 01/08/2017, 3:18 PM

## 2017-01-08 NOTE — Telephone Encounter (Signed)
Patient scheduled for TCM with [place] on 01-15-17 @ 2:00pm , scheduled with Angie. Angie states that Dr. Radford Pax wanted pt to be seen next week.

## 2017-01-08 NOTE — Plan of Care (Signed)
Problem: Pain Managment: Goal: General experience of comfort will improve Outcome: Progressing Pt denies any CP. Does c/o occasional headache. Medication administered w/ relief.  Problem: Physical Regulation: Goal: Ability to maintain clinical measurements within normal limits will improve Outcome: Progressing Pt tolerating activity well with no c/o Chest pain. Pt tachycardic at start of shift, NSR with rest.  Other VSS. Will continue to monitor.

## 2017-01-08 NOTE — Consult Note (Signed)
Cardiology Consult    Patient ID: [name] MRN: [id number], DOB/AGE: 52-Nov-1967   Admit date: 01/07/2017 Date of Consult: 01/08/2017  Primary Physician: Chesley Noon, MD Primary Cardiologist: new - Dr. Radford Pax Requesting Provider: Dr. Beryle Beams  Reason for Consult: chest pain  Patient Profile    [name] has a PMH significant for HTN, HLD, myalgia, and episcleritis. She presented to [facility] with new onset chest pain.  [name] is a 51 y.o. female who is being seen today for the evaluation of chest pain at the request of Dr. Beryle Beams.   Past Medical History   Past Medical History:  Diagnosis Date  . DJD (degenerative joint disease)    knees and ankles  . Hematuria   . History of endometriosis   . History of kidney stones   . Hyperlipidemia   . Hypertension   . Migraines   . PONV (postoperative nausea and vomiting)   . Right ureteral stone   . Wears contact lenses     Past Surgical History:  Procedure Laterality Date  . CYSTOSCOPY WITH RETROGRADE PYELOGRAM, URETEROSCOPY AND STENT PLACEMENT Right 10/04/2014   Procedure: CYSTOSCOPY WITH RETROGRADE PYELOGRAM, URETEROSCOPY ;  Surgeon: Rana Snare, MD;  Location: [facility];  Service: Urology;  Laterality: Right;  . DILATION AND EVACUATION  1993  . HOLMIUM LASER APPLICATION Right 09/15/8117   Procedure: HOLMIUM LASER APPLICATION;  Surgeon: Rana Snare, MD;  Location: [facility];  Service: Urology;  Laterality: Right;  . LAPAROSCOPY LEFT OVARIAN CYSTECTOMY  06-17-2007   and Partial Left Salpingectomy  . LAPAROSCOPY RIGHT TUBAL LIGATION/  REMOVAL IUD/  HYSTEROSCOPY GYNECARE ENDOMETRIAL BALLOON ABLATION  07-14-2003  . RIGHT URETEROSCPIC LASER LITHOTRIPSY STONE EXTRACTION  02-15-2007     Allergies  Allergies  Allergen Reactions  . Cephalosporins Hives  . Penicillins Hives    History of Present Illness    [name] presented to [facility] with new onset chest pain  starting 01/06/17. 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 She was not exerting herself and developed precordial chest pain across her entire chest. The pain was rated as a 3/10 and lasted approximately 20-25 minutes. No associated symptoms. She felt the pain start to return that evening. She took 2 ASA and was able to sleep. The pain woke her from sleep at 1:00AM and was sharp, worse with inspiration, and rated as a 10/10. She was nauseous without vomiting and diaphoretic. The pain radiated down her left arm. She called EMS who administered nitrol SL x 2 with near resolution of her chest pain. On arrival to the ED, she was nearly chest pain free. She was admitted and echocardiogram showed no pericardial effusion, but grade 1 DD.   She has been treated with colchicine and toradol for possible pericarditis. She denies recent illness; however, her WBC on admission ws 14.2. She is afebrile and leukocytosis has resolved this morning. Troponins have been mildly elevated and flat. EKG with nonspecific ST changes.  Inpatient Medications    . colchicine  0.6 mg Oral BID  . DULoxetine  30 mg Oral Daily  . enoxaparin (LOVENOX) injection  40 mg Subcutaneous Q24H  . ibuprofen  800 mg Oral TID  . rosuvastatin  20 mg Oral QPM  . sodium chloride flush  3 mL Intravenous Q12H     Outpatient Medications    Prior to Admission medications   Medication Sig  Start Date End Date Taking? Authorizing Provider  amLODipine (NORVASC) 5 MG tablet Take 5 mg by mouth every evening.    Yes [provider]  DULoxetine (CYMBALTA) 30 MG capsule Take 30 mg by mouth daily. 01/05/17  Yes [provider]  rosuvastatin (CRESTOR) 20 MG tablet Take 20 mg by mouth every evening. 12/25/16  Yes [provider]  zolmitriptan (ZOMIG) 5 MG tablet Take 5 mg by mouth as needed for migraine.   Yes [provider]     Family History    Family History   Problem Relation Age of Onset  . CAD Father        stents in his 80s    Social History    Social History   Social History  . Marital status: Married    Spouse name: N/A  . Number of children: N/A  . Years of education: N/A   Occupational History  . Not on file.   Social History Main Topics  . Smoking status: Never Smoker  . Smokeless tobacco: Never Used  . Alcohol use No  . Drug use: No  . Sexual activity: Not on file   Other Topics Concern  . Not on file   Social History Narrative  . No narrative on file     Review of Systems    General:  No chills, fever, night sweats or weight changes.  Cardiovascular:  + chest pain, dyspnea on exertion, edema, orthopnea, palpitations, paroxysmal nocturnal dyspnea. Dermatological: No rash, lesions/masses Respiratory: No cough, dyspnea Urologic: No hematuria, dysuria Abdominal:   No nausea, vomiting, diarrhea, bright red blood per rectum, melena, or hematemesis Neurologic:  No visual changes, changes in mental status. All other systems reviewed and are otherwise negative except as noted above.  Physical Exam    Blood pressure 136/74, pulse 92, temperature 98.2 F (36.8 C), temperature source Oral, resp. rate 14, height 5' (1.524 m), weight 183 lb 6.4 oz (83.2 kg), last menstrual period 04/09/2012, SpO2 96 %.  General: Pleasant, NAD Psych: Normal affect. Neuro: Alert and oriented X 3. Moves all extremities spontaneously. HEENT: Normal  Neck: Supple without bruits or JVD. Lungs:  Resp regular and unlabored, CTA. Heart: RRR no s3, s4, or murmurs. Abdomen: Soft, non-tender, non-distended, BS + x 4.  Extremities: No clubbing, cyanosis or edema. DP/PT/Radials 2+ and equal bilaterally.  Labs    Troponin [facility] of Care Test)  Recent Labs  01/07/17 1059  TROPIPOC 0.06    Recent Labs  01/07/17 1534 01/07/17 2140 01/08/17 0253  TROPONINI 0.06* 0.04* 0.04*   Lab Results  Component Value Date   WBC 9.0 01/08/2017    HGB 11.5 (L) 01/08/2017   HCT 35.8 (L) 01/08/2017   MCV 81.4 01/08/2017   PLT 202 01/08/2017     Recent Labs Lab 01/07/17 1330 01/08/17 0259  NA  --  139  K  --  3.6  CL  --  109  CO2  --  25  BUN  --  7  CREATININE  --  0.61  CALCIUM  --  8.1*  PROT 7.0  --   BILITOT 0.6  --   ALKPHOS 104  --   ALT 19  --   AST 26  --   GLUCOSE  --  93   No results found for: CHOL, HDL, LDLCALC, TRIG Lab Results  Component Value Date   DDIMER 0.45 01/07/2017     Radiology Studies    Dg Chest 2 View  Result  Date: 01/07/2017 CLINICAL DATA:  Left-sided chest pain. EXAM: CHEST  2 VIEW COMPARISON:  No recent. FINDINGS: Mediastinum hilar structures normal. Low lung volumes with mild basilar atelectasis. No pleural effusion or pneumothorax. Heart size normal. No acute bony abnormality. IMPRESSION: Low lung volumes with mild basilar atelectasis, otherwise negative chest. Electronically Signed   By: Catlett   On: 01/07/2017 07:00   Ct Angio Chest Pe W And/or Wo Contrast  Result Date: 01/07/2017 CLINICAL DATA:  Shortness of breath.  Continued pain. EXAM: CT ANGIOGRAPHY CHEST WITH CONTRAST TECHNIQUE: Multidetector CT imaging of the chest was performed using the standard protocol during bolus administration of intravenous contrast. Multiplanar CT image reconstructions and MIPs were obtained to evaluate the vascular anatomy. CONTRAST:  100 cc Isovue 370 intravenous COMPARISON:  None. FINDINGS: Cardiovascular: Satisfactory opacification of the pulmonary arteries to the segmental level. No evidence of pulmonary embolism. Normal heart size. No pericardial effusion. Overall mild atherosclerosis, mainly a plaque involving the proximal subclavian artery with stenosis measures up 35%. Mediastinum/Nodes: Goiter with subtle nodules, none discretely visible greater than 1.5 cm. Negative for adenopathy. Lungs/Pleura: There is no edema, consolidation, effusion, or pneumothorax. Minimal atelectasis. Upper  Abdomen: 17 mm right adrenal nodule. Given arterial timing, this is essentially noncontrast evaluation of this nodule, which measures 9 Hounsfield units, consistent with an adenoma. The nodule is stable from 2016 abdominal CT. Musculoskeletal: No acute or aggressive finding. Review of the MIP images confirms the above findings. IMPRESSION: 1. Negative for pulmonary embolism or other acute finding. 2. Atherosclerosis. 3. Goiter and right adrenal adenoma. Electronically Signed   By: Monte Fantasia M.D.   On: 01/07/2017 09:43    ECG & Cardiac Imaging    EKG 01/07/17: nonspecific ST changes  Echocardiogram 01/07/17: Study Conclusions - Left ventricle: The cavity size was normal. Wall thickness was   normal. Systolic function was normal. The estimated ejection   fraction was in the range of 60% to 65%. Wall motion was normal;   there were no regional wall motion abnormalities. Doppler   parameters are consistent with abnormal left ventricular   relaxation (grade 1 diastolic dysfunction). - Aortic valve: Poorly visualized. There was no stenosis. - Right ventricle: The cavity size was normal. Systolic function   was normal. - Tricuspid valve: Peak RV-RA gradient (S): 18 mm Hg. - Pulmonary arteries: PA peak pressure: 21 mm Hg (S). - Inferior vena cava: The vessel was normal in size. The   respirophasic diameter changes were in the normal range (>= 50%),   consistent with normal central venous pressure.  Impressions: - Normal LV size with EF 60-65%. Normal RV size and systolic   function. No significant valvular abnormalities. - no pericardial effusion  Assessment & Plan    1. Chest pain: CAD vs pericarditis - troponin 0.06 > 0.04 > 0.04 - CRP 5.1, Sed rate 55 - CTA negative for PE - pt has risk factors for ACS including HTN, HLD, a family history of ACS (father in his 7s), and a ?autoimmune disorder. She also has risk factors for pericarditis, including elevated CRP and initial  leukocytosis, now resolved. She denies recent illness and has been afebrile. Echocardiogram without pericardial effusion. Given her typical and atypical features of chest pain and risk factors, will likely need further ischemic evaluation. Will discuss with attending lexiscan myoview (myalgias in both knees) and cardiac MRI to evaluate possible myocarditis/pericarditis.  2. HTN - continue norvasc  3. HLD - continue statin  4. Polyarthralgia, myalgia - cymbalta  from rheumatology - labs with rheumatology 10/2016 were negative  Signed, Ledora Bottcher, PA-C 01/08/2017, 10:50 AM [phone]  Patient seen and independently examined with Fabian Sharp, [place]. We discussed all aspects of the encounter. I agree with the assessment and plan as stated above.  Patient with history of HTN, hyperlipidemia and family history of CAD who has had recent muscle and joint pain and had complete workup at [facility] that was essentially normal.  She is now being treated with Cymbalta for her myalgias.  Tuesday and chest pressure while riding in a golf cart.  She went home and after 25 minutes resolved but reoccurred with nausea and diaphoresis and radiated down her left arm.  She called EMS and pain resolved after SL NTG x 2.  Now she complains of pressure intermittently only with deep breathing.  There is no change in pain with supine vs. Sitting position.  EKG with no acute changes.  2D echo with normal LVF and no pericardial effusion. She has had a dry cough for a few days with low grade temp of 99.2 yesterday and elevated WBC on admission.  Her sed rate is elevated at 55 and elevated CRP.  I do not have results of labs done by her Rhematologist in May to note if sed rate was elevated then.    Exam shows clear lungs, heart RRR with no M/R/G, no LE edema.  Minimal trop elevated at 0.06/0.04/0.04 with normal LVF on echo.  She has significant CRFs so recommend nuclear stress test.  If no ischemia then would treat for  pericarditis with colchicine and NSAIDs.    Signed: Fransico Him, MD [facility] 01/08/2017

## 2017-01-09 NOTE — H&P (Signed)
Medicine attending discharge note: I personally examined this patient on the day of discharge and I attest to the accuracy of the discharge evaluation and plan which will subsequently be recorded in the discharge summary by resident physician Dr. Rochele Pages. 51 year old woman admitted for further evaluation of pleuritic chest pain.  See separate resident and attending history and physical exams for complete details. Findings most consistent with acute pericarditis.  She had a rapid response to nonsteroidals and colchicine.  She was seen in consultation by cardiology.  In view of underlying cardiac risk factors, a stress test was recommended.  No acute ischemic changes noted during the test.  Study result was low risk.  Left ventricular ejection fraction estimated 55-65%.  Disposition: Condition stable at time of discharge Follow-up with her primary care physician There were no complications

## 2017-01-09 NOTE — Discharge Summary (Signed)
Name: [name] MRN: [id number] DOB: 1965-06-11 51 y.o. PCP: Chesley Noon, MD  Date of Admission: 01/07/2017  3:16 AM Date of Discharge: 8/22018 Attending Physician: Dr. Beryle Beams  Discharge Diagnosis: 1. Acute Pericarditis  Active Problems:   Chest pain   Essential hypertension   Familial hypercholesterolemia   Migraine with aura and without status migrainosus, not intractable   Elevated sed rate   Discharge Medications: Allergies as of 01/08/2017      Reactions   Cephalosporins Hives   Penicillins Hives      Medication List    TAKE these medications   amLODipine 5 MG tablet Commonly known as:  NORVASC Take 5 mg by mouth every evening.   colchicine 0.6 MG tablet Take 1 tablet (0.6 mg total) by mouth 2 (two) times daily.   DULoxetine 30 MG capsule Commonly known as:  CYMBALTA Take 30 mg by mouth daily.   ibuprofen 800 MG tablet Commonly known as:  ADVIL,MOTRIN Take 1 tablet (800 mg total) by mouth 3 (three) times daily. Take 1 tablet 800 mg three times daily for one week. The second week take 1 tablet 800 mg twice daily for a total seven days. Then stop the medication.   omeprazole 40 MG capsule Commonly known as:  PRILOSEC Take 1 capsule (40 mg total) by mouth daily.   rosuvastatin 20 MG tablet Commonly known as:  CRESTOR Take 20 mg by mouth every evening.   zolmitriptan 5 MG tablet Commonly known as:  ZOMIG Take 5 mg by mouth as needed for migraine.       Disposition and follow-up:   [name] was discharged from [facility] in Stable condition.  At the hospital follow up visit please address:  1.  Acute Pericarditis: management, tapering Ibuprofen dose as needed  2.  Labs / imaging needed at time of follow-up: ESR, CRP to evaluate response to treatment  3.  Pending labs/ test needing follow-up: n/a  Follow-up Appointments: Follow-up Information    Chesley Noon, MD. Schedule an appointment as soon as  possible for a visit in 1 week(s).   Specialty:  Family Medicine Contact information: [place] [address] [phone]        Consuelo Pandy, PA-C Follow up on 01/15/2017.   Specialties:  Cardiology, Radiology Why:  2:00 TCM for hospital follow up Contact information: [place] [address] [phone]           Hospital Course by problem list: Active Problems:   Chest pain   Essential hypertension   Familial hypercholesterolemia   Migraine with aura and without status migrainosus, not intractable   Elevated sed rate   1. Acute Pericarditis [name] was admitted to [facility] to the IMTS service for chest pain. Upon initial presentation, [name] was experiencing pleuritic, centralized chest pain, with radiation to her left shoulder. She was tachycardic, and the pain was reproducible on physical examination. In the emergency department, initial evaluation resulted in obtaining a chest xray, EKG, and troponin. The chest xray was normal, EKG showed sinus tachycardia,  troponin was mildly elevated. The patient received fluids but remained tachycardic with her oxygen saturation trending downward. A D-dimer was obtained because of concern for pulmonary embolism, which was within normal limits, but due to her persistent tachycardia and chest pain, a CT Angiogram of the chest was obtained. There was no evidence of pulmonary embolism, pericardial effusion, pneumonia, or other  acute process.  Another EKG was obtained, which had findings consistent with pericarditis She also had an elevated ESR and CRP. [name] was subsequently treated for pericarditis with NSAIDs and colchicine. Following initiation of treatment her chest pain drastically improved. A 2D echocardiogram was obtained, and showed no abnormalities with a LV ejection fraction of 60-65%. Cardiology evaluated the patient and due to her strong family history of coronary  artery disease, and cardiac risk factors, decided to obtain a nuclear stress test to definitively rule out an ischemic event. The study showed no ischemia or infarction.   2. Hypertension [name] was kept on her home dose of amlodipine and her blood pressure remained stable throughout her admission.   3. Multinodular Goiter The chest CT scan [name] received in the emergency department revealed a multinodular goiter. She had no clinical signs and symptoms of thyroid disease. A thyroid stimulating hormone level was ordered and was within normal limits.    Discharge Vitals:   BP 138/89   Pulse 92   Temp 98.2 F (36.8 C) (Oral)   Resp 14   Ht 5' (1.524 m)   Wt 183 lb 6.4 oz (83.2 kg)   LMP 04/09/2012   SpO2 96%   BMI 35.82 kg/m   Pertinent Labs, Studies, and Procedures:  CMP Latest Ref Rng & Units 01/08/2017 01/07/2017 10/04/2014  Glucose 65 - 99 mg/dL 93 127(H) 92  BUN 6 - 20 mg/dL 7 15 -  Creatinine 0.44 - 1.00 mg/dL 0.61 0.62 -  Sodium 135 - 145 mmol/L 139 135 138  Potassium 3.5 - 5.1 mmol/L 3.6 3.4(L) 4.0  Chloride 101 - 111 mmol/L 109 104 -  CO2 22 - 32 mmol/L 25 22 -  Calcium 8.9 - 10.3 mg/dL 8.1(L) 8.3(L) -  Total Protein 6.5 - 8.1 g/dL - 7.0 -  Total Bilirubin 0.3 - 1.2 mg/dL - 0.6 -  Alkaline Phos 38 - 126 U/L - 104 -  AST 15 - 41 U/L - 26 -  ALT 14 - 54 U/L - 19 -   CBC CBC Latest Ref Rng & Units 01/08/2017 01/07/2017 10/04/2014  WBC 4.0 - 10.5 K/uL 9.0 14.2(H) -  Hemoglobin 12.0 - 15.0 g/dL 11.5(L) 13.0 15.0  Hematocrit 36.0 - 46.0 % 35.8(L) 39.8 44.0  Platelets 150 - 400 K/uL 202 204 -   CBC    Component Value Date/Time   WBC 9.0 01/08/2017 0259   RBC 4.40 01/08/2017 0259   HGB 11.5 (L) 01/08/2017 0259   HCT 35.8 (L) 01/08/2017 0259   PLT 202 01/08/2017 0259   MCV 81.4 01/08/2017 0259   MCH 26.1 01/08/2017 0259   MCHC 32.1 01/08/2017 0259   RDW 14.1 01/08/2017 0259   LYMPHSABS 1.8 01/07/2017 0330   MONOABS 1.2 (H) 01/07/2017 0330   EOSABS 0.1  01/07/2017 0330   BASOSABS 0.0 01/07/2017 0330   Erythrocyte Sedimentation Rate     Component Value Date/Time   ESRSEDRATE 55 (H) 01/07/2017 1534   C-Reactive Protein     Component Value Date/Time   CRP 5.1 (H) 01/07/2017 1534   Troponin  0.06, 0.04, 0.04   Transthoracic Echocardiography Study Conclusions: - Left ventricle: The cavity size was normal. Wall thickness was   normal. Systolic function was normal. The estimated ejection   fraction was in the range of 60% to 65%. Wall motion was normal;   there were no regional wall motion abnormalities. Doppler   parameters are consistent with abnormal left ventricular   relaxation (  grade 1 diastolic dysfunction). - Aortic valve: Poorly visualized. There was no stenosis. - Right ventricle: The cavity size was normal. Systolic function   was normal. - Tricuspid valve: Peak RV-RA gradient (S): 18 mm Hg. - Pulmonary arteries: PA peak pressure: 21 mm Hg (S). - Inferior vena cava: The vessel was normal in size. The   respirophasic diameter changes were in the normal range (>= 50%),   consistent with normal central venous pressure  NM Myocar Multi W/Spect W/Wall Motion / EF Study Result    There was no ST segment deviation noted during stress.  No T wave inversion was noted during stress.  The study is normal.  This is a low risk study.  The left ventricular ejection fraction is normal (55-65%).   Normal resting and stress perfusion. No ischemia or infarction EF 57%     Discharge Instructions: Discharge Instructions    Diet - low sodium heart healthy    Complete by:  As directed    Diet - low sodium heart healthy    Complete by:  As directed    Discharge instructions    Complete by:  As directed    Please take Ibuprofen 800 mg three times a day until Tuesday, August 7th for a total of 1 week. Followed by Ibuprofen 800 mg twice daily for one week. Then stop the medication.   Please take colchicine 0.6 mg twice daily  for three months until October 24th, 2018. Then stop the medication  Follow up with your primary care physician within 1-2 weeks after your discharge from the hospital.   Increase activity slowly    Complete by:  As directed    Increase activity slowly    Complete by:  As directed       Signed: Melanee Spry, MD 01/09/2017, 12:30 PM   Pager: [phone]

## 2017-01-09 NOTE — Telephone Encounter (Signed)
Call placed to both phone numbers listed under Pt name.  Both numbers the line just rings, no transfer to a VM.  This is 1st outreach.  Pt discharged from [facility] 01/08/2017.

## 2017-01-12 NOTE — Telephone Encounter (Signed)
LM TO CALL BACK ./CY 

## 2017-01-12 NOTE — Telephone Encounter (Signed)
Patient contacted regarding discharge from [facility] on 01/08/17.  Patient understands to follow up with provider Ellen Henri on 8/17 at 9:30 am. Patient understands discharge instructions? yes Patient understands medications and regiment? yes Patient understands to bring all medications to this visit? yes Pt reports she is doing well.  Reviewed results of nuc study completed while in the hospital.  Rescheduled her appt to f/u as she states 8/9 is not a good day for her.  She has not further questions or concerns.

## 2017-01-15 ENCOUNTER — Ambulatory Visit: Payer: BLUE CROSS/BLUE SHIELD | Admitting: Cardiology

## 2017-01-23 ENCOUNTER — Ambulatory Visit (INDEPENDENT_AMBULATORY_CARE_PROVIDER_SITE_OTHER): Payer: BLUE CROSS/BLUE SHIELD | Admitting: Cardiology

## 2017-01-23 ENCOUNTER — Encounter: Payer: Self-pay | Admitting: Cardiology

## 2017-01-23 VITALS — BP 120/86 | HR 88 | Ht 61.0 in | Wt 180.2 lb

## 2017-01-23 DIAGNOSIS — I300 Acute nonspecific idiopathic pericarditis: Secondary | ICD-10-CM | POA: Diagnosis not present

## 2017-01-23 LAB — C-REACTIVE PROTEIN: CRP: 1.4 mg/L (ref 0.0–4.9)

## 2017-01-23 LAB — SEDIMENTATION RATE: Sed Rate: 14 mm/hr (ref 0–40)

## 2017-01-23 NOTE — Patient Instructions (Addendum)
Medication Instructions:   Your physician recommends that you continue on your current medications as directed. Please refer to the Current Medication list given to you today.   If you need a refill on your cardiac medications before your next appointment, please call your pharmacy.  Labwork:  C-REACTIVE PROTEIN  AND ESR    Testing/Procedures: NONE ORDERED  TODAY    Follow-Up: WITH DR. Radford Pax  IN 3 TO 4 MONTHS   Any Other Special Instructions Will Be Listed Below (If Applicable).  CALL CLINIC FOR ANY SYMPTOMS WITH STILL TAKING COLCHICINE

## 2017-01-23 NOTE — Progress Notes (Signed)
01/23/2017 [place]   [birth year]  [id number]  Primary Physician Chesley Noon, MD Primary Cardiologist: Dr. Radford Pax    Reason for Visit/CC: [facility] F/u for Acute Pericarditis   HPI:  [name] is a 51 y.o. female who is being seen today for post hospital f/u. 3 weeks ago, she presented to [facility] with a complaint of chest pain. Her pain was sharp and worse with inspiration. CT angio was negative for PE. 2D echo with normal LVF and no pericardial effusion. Shehad a dry cough for a few days with low grade temp of 99.2 and elevated WBC on admission.  Her sed rate was elevated at 55 and elevated CRP at 5.1. NST was low risk, no ischemia or infarction. She was treated for presume pericarditis. She was started on Ibuprofen, with taper, and instructed to continue x 10 days. She was also placed on colchicine, to be continued x 3 months. A PPI was also prescribed for GI protection.   She presents to clinic today for f/u. She reports that she has done well. Her symptoms have resolved. No recurrence. She continues to deny dyspnea. She has finished her course of ibuprofen and still taking colchicine.  No side effects. BP and HR are both well controlled.   Current Meds  Medication Sig  . amLODipine (NORVASC) 5 MG tablet Take 5 mg by mouth every evening.   . colchicine 0.6 MG tablet Take 1 tablet (0.6 mg total) by mouth 2 (two) times daily.  . DULoxetine (CYMBALTA) 30 MG capsule Take 30 mg by mouth daily.  Marland Kitchen ibuprofen (ADVIL,MOTRIN) 800 MG tablet Take 1 tablet (800 mg total) by mouth 3 (three) times daily. Take 1 tablet 800 mg three times daily for one week. The second week take 1 tablet 800 mg twice daily for a total seven days. Then stop the medication.  Marland Kitchen omeprazole (PRILOSEC) 40 MG capsule Take 1 capsule (40 mg total) by mouth daily.  . rosuvastatin (CRESTOR) 20 MG tablet Take 20 mg by mouth every evening.  . zolmitriptan (ZOMIG) 5 MG tablet Take 5 mg by mouth as needed for  migraine.   Allergies  Allergen Reactions  . Cephalosporins Hives  . Penicillins Hives   Past Medical History:  Diagnosis Date  . DJD (degenerative joint disease)    knees and ankles  . Hematuria   . History of endometriosis   . History of kidney stones   . Hyperlipidemia   . Hypertension   . Migraines   . PONV (postoperative nausea and vomiting)   . Right ureteral stone   . Wears contact lenses    Family History  Problem Relation Age of Onset  . CAD Father        stents in his 54s   Past Surgical History:  Procedure Laterality Date  . CYSTOSCOPY WITH RETROGRADE PYELOGRAM, URETEROSCOPY AND STENT PLACEMENT Right 10/04/2014   Procedure: CYSTOSCOPY WITH RETROGRADE PYELOGRAM, URETEROSCOPY ;  Surgeon: Rana Snare, MD;  Location: [facility];  Service: Urology;  Laterality: Right;  . DILATION AND EVACUATION  1993  . HOLMIUM LASER APPLICATION Right 10/12/3974   Procedure: HOLMIUM LASER APPLICATION;  Surgeon: Rana Snare, MD;  Location: [facility];  Service: Urology;  Laterality: Right;  . LAPAROSCOPY LEFT OVARIAN CYSTECTOMY  06-17-2007   and Partial Left Salpingectomy  . LAPAROSCOPY RIGHT TUBAL LIGATION/  REMOVAL IUD/  HYSTEROSCOPY GYNECARE ENDOMETRIAL BALLOON ABLATION  07-14-2003  . RIGHT URETEROSCPIC LASER LITHOTRIPSY STONE EXTRACTION  02-15-2007   Social History   Social History  . Marital status: Married    Spouse name: N/A  . Number of children: N/A  . Years of education: N/A   Occupational History  . Not on file.   Social History Main Topics  . Smoking status: Never Smoker  . Smokeless tobacco: Never Used  . Alcohol use No  . Drug use: No  . Sexual activity: Not on file   Other Topics Concern  . Not on file   Social History Narrative  . No narrative on file     Review of Systems: General: negative for chills, fever, night sweats or weight changes.  Cardiovascular: negative for chest pain, dyspnea on exertion, edema,  orthopnea, palpitations, paroxysmal nocturnal dyspnea or shortness of breath Dermatological: negative for rash Respiratory: negative for cough or wheezing Urologic: negative for hematuria Abdominal: negative for nausea, vomiting, diarrhea, bright red blood per rectum, melena, or hematemesis Neurologic: negative for visual changes, syncope, or dizziness All other systems reviewed and are otherwise negative except as noted above.   Physical Exam:  Blood pressure 120/86, pulse 88, height '5\' 1"'$  (1.549 m), weight 180 lb 3.2 oz (81.7 kg), last menstrual period 04/09/2012, SpO2 96 %.  General appearance: alert, cooperative and no distress Neck: no carotid bruit and no JVD Lungs: clear to auscultation bilaterally Heart: regular rate and rhythm, S1, S2 normal, no murmur, click, rub or gallop Extremities: extremities normal, atraumatic, no cyanosis or edema Pulses: 2+ and symmetric Skin: Skin color, texture, turgor normal. No rashes or lesions Neurologic: Grossly normal  EKG not performed -- personally reviewed   ASSESSMENT AND PLAN:   1. Acute Idiopathic Pericarditis: symptoms have resolved after treatment course with ibuprofen. No recurrent symptoms. 2D echo w/o effusion. She continues on Colchicine w/o side effects. We will repeat CRP and ESR to see if levels have improved. Continue Colchicine x 3 months. She will call if any recurrent symptoms. F/u with Dr. Radford Pax in 3-4 months. If she is still doing well w/o recurrence, she can then f/u only as needed.    [name], MHS [facility] 01/23/2017 9:34 AM

## 2017-02-10 ENCOUNTER — Telehealth: Payer: Self-pay | Admitting: *Deleted

## 2017-02-10 NOTE — Telephone Encounter (Signed)
Pt returned Chelsie's call re: lab results. She has been made aware that her inflammatory markers are back to normal and to continue the Colchicine for 3 months. Pt verbalized understanding.

## 2017-04-27 ENCOUNTER — Encounter (INDEPENDENT_AMBULATORY_CARE_PROVIDER_SITE_OTHER): Payer: Self-pay

## 2017-04-27 ENCOUNTER — Encounter: Payer: Self-pay | Admitting: Cardiology

## 2017-04-27 ENCOUNTER — Ambulatory Visit (INDEPENDENT_AMBULATORY_CARE_PROVIDER_SITE_OTHER): Payer: BLUE CROSS/BLUE SHIELD | Admitting: Cardiology

## 2017-04-27 VITALS — BP 132/68 | HR 102 | Ht 61.0 in | Wt 185.6 lb

## 2017-04-27 DIAGNOSIS — I10 Essential (primary) hypertension: Secondary | ICD-10-CM | POA: Diagnosis not present

## 2017-04-27 DIAGNOSIS — R600 Localized edema: Secondary | ICD-10-CM | POA: Diagnosis not present

## 2017-04-27 DIAGNOSIS — I319 Disease of pericardium, unspecified: Secondary | ICD-10-CM

## 2017-04-27 HISTORY — DX: Disease of pericardium, unspecified: I31.9

## 2017-04-27 NOTE — Progress Notes (Signed)
Cardiology Office Note:    Date:  04/27/2017   ID:  KELLE RUPPERT, DOB 1966/05/23, MRN [id number]  PCP:  Chesley Noon, MD  Cardiologist:  Fransico Him, MD   Referring MD: Chesley Noon, MD   Chief Complaint  Patient presents with  . Follow-up    pericarditis    History of Present Illness:    [name] is a 51 y.o. female with recent hospitalization for acute pericarditis. She presented to [facility] with a complaint of chest pain. Her pain was sharp and worse with inspiration. CT angio was negative for PE. 2D echo with normal LVF and no pericardial effusion. She had a dry cough for a few days with low grade temp of 99.2 and elevated WBC on admission. Her sed rate was elevated at 55 and elevated CRP at 5.1. NST was low risk, no ischemia or infarction. She was treated for presume pericarditis. She was started on Ibuprofen, with taper, and instructed to continue x 10 days. She was also placed on colchicine, to be continued x 3 months. A PPI was also prescribed for GI protection. She was seen back by the PA and was doing well with no further CP.  Repeat sed rate and CRP were normal.   She is now here today for followup.  She is doing well.  She denies any further CP or sharp pleuritic pain.  She denies any SOB, DOE, dizziness or palpitations.  She does have chronic LE edema.      Past Medical History:  Diagnosis Date  . DJD (degenerative joint disease)    knees and ankles  . Hematuria   . History of endometriosis   . History of kidney stones   . Hyperlipidemia   . Hypertension   . Migraines   . Pericarditis 04/27/2017  . PONV (postoperative nausea and vomiting)   . Right ureteral stone   . Wears contact lenses     Past Surgical History:  Procedure Laterality Date  . CYSTOSCOPY WITH RETROGRADE PYELOGRAM, URETEROSCOPY Right 10/04/2014   Performed by Rana Snare, MD at [facility]  . DILATION AND EVACUATION  1993  . HOLMIUM LASER APPLICATION Right  7/32/2025   Performed by Rana Snare, MD at [facility]  . LAPAROSCOPY LEFT OVARIAN CYSTECTOMY  06-17-2007   and Partial Left Salpingectomy  . LAPAROSCOPY RIGHT TUBAL LIGATION/  REMOVAL IUD/  HYSTEROSCOPY GYNECARE ENDOMETRIAL BALLOON ABLATION  07-14-2003  . RIGHT URETEROSCPIC LASER LITHOTRIPSY STONE EXTRACTION  02-15-2007    Current Medications: Current Meds  Medication Sig  . amLODipine (NORVASC) 5 MG tablet Take 5 mg by mouth every evening.   . DULoxetine (CYMBALTA) 30 MG capsule Take 30 mg by mouth daily.  Marland Kitchen levothyroxine (SYNTHROID, LEVOTHROID) 75 MCG tablet   . rosuvastatin (CRESTOR) 20 MG tablet Take 20 mg by mouth every evening.  . zolmitriptan (ZOMIG) 5 MG tablet Take 5 mg by mouth as needed for migraine.  . [DISCONTINUED] colchicine 0.6 MG tablet Take 1 tablet (0.6 mg total) by mouth 2 (two) times daily.     Allergies:   Cephalosporins and Penicillins   Social History   Socioeconomic History  . Marital status: Married    Spouse name: None  . Number of children: None  . Years of education: None  . Highest education level: None  Social Needs  . Financial resource strain: None  . Food insecurity - worry: None  . Food insecurity - inability: None  . Transportation  needs - medical: None  . Transportation needs - non-medical: None  Occupational History  . None  Tobacco Use  . Smoking status: Never Smoker  . Smokeless tobacco: Never Used  Substance and Sexual Activity  . Alcohol use: No  . Drug use: No  . Sexual activity: None  Other Topics Concern  . None  Social History Narrative  . None     Family History: The patient's family history includes CAD in her father.  ROS:   Please see the history of present illness.    ROS  All other systems reviewed and negative.   EKGs/Labs/Other Studies Reviewed:    The following studies were reviewed today: none  EKG:  EKG is not ordered today.  Recent Labs: 01/07/2017: ALT 19; Magnesium 2.1; TSH  2.396 01/08/2017: BUN 7; Creatinine, Ser 0.61; Hemoglobin 11.5; Platelets 202; Potassium 3.6; Sodium 139   Recent Lipid Panel No results found for: CHOL, TRIG, HDL, CHOLHDL, VLDL, LDLCALC, LDLDIRECT  Physical Exam:    VS:  BP 132/68   Pulse (!) 102   Ht 5\' 1"  (1.549 m)   Wt 185 lb 9.6 oz (84.2 kg)   LMP 04/09/2012   SpO2 95%   BMI 35.07 kg/m     Wt Readings from Last 3 Encounters:  04/27/17 185 lb 9.6 oz (84.2 kg)  01/23/17 180 lb 3.2 oz (81.7 kg)  01/08/17 183 lb 6.4 oz (83.2 kg)     GEN:  Well nourished, well developed in no acute distress HEENT: Normal NECK: No JVD; No carotid bruits LYMPHATICS: No lymphadenopathy CARDIAC: RRR, no murmurs, rubs, gallops RESPIRATORY:  Clear to auscultation without rales, wheezing or rhonchi  ABDOMEN: Soft, non-tender, non-distended MUSCULOSKELETAL:  No edema; No deformity  SKIN: Warm and dry NEUROLOGIC:  Alert and oriented x 3 PSYCHIATRIC:  Normal affect   ASSESSMENT:    1. Pericarditis, unspecified chronicity, unspecified type   2. Essential hypertension   3. Edema extremities    PLAN:    In order of problems listed above:  1. Pericarditis in setting of acute viral syndrome - Chest pain has resolved and CRP and sed rate have normalized post treatment with NSAIDs and colchicine.  I will stop her colchicine at this time as she has had 3 months of treatment.  She will let me know if she has any reoccurene of CP.  I will see her back in 1 year.  Of note, nuclear stress test showed no ischemia.    2.  HTN - BP is well controlled on exam today.  She will continue on amlodipine 5mg  daily.    3.  LE edema - none on exam today.  This may be due to her amlodipine and I recommended that she talk with her PCP about other BP med options.    Medication Adjustments/Labs and Tests Ordered: Current medicines are reviewed at length with the patient today.  Concerns regarding medicines are outlined above.  No orders of the defined types were  placed in this encounter.  No orders of the defined types were placed in this encounter.   Signed, Fransico Him, MD  04/27/2017 3:35 PM    [place]

## 2017-04-27 NOTE — Patient Instructions (Signed)
Medication Instructions:  Your physician has recommended you make the following change in your medication:   STOP: colchicine   Labwork: None ordered   Testing/Procedures: None ordered   Follow-Up: Your physician wants you to follow-up in: 1 year with Dr. Radford Pax. You will receive a reminder letter in the mail two months in advance. If you don't receive a letter, please call our office to schedule the follow-up appointment.   Any Other Special Instructions Will Be Listed Below (If Applicable).     If you need a refill on your cardiac medications before your next appointment, please call your pharmacy.

## 2017-06-23 DIAGNOSIS — M797 Fibromyalgia: Secondary | ICD-10-CM

## 2017-06-23 HISTORY — DX: Fibromyalgia: M79.7

## 2017-08-11 ENCOUNTER — Encounter: Payer: Self-pay | Admitting: Obstetrics & Gynecology

## 2017-10-29 ENCOUNTER — Encounter: Payer: Self-pay | Admitting: Obstetrics & Gynecology

## 2017-10-29 ENCOUNTER — Ambulatory Visit (INDEPENDENT_AMBULATORY_CARE_PROVIDER_SITE_OTHER): Payer: BLUE CROSS/BLUE SHIELD | Admitting: Obstetrics & Gynecology

## 2017-10-29 VITALS — BP 124/80 | Ht 61.0 in | Wt 185.0 lb

## 2017-10-29 DIAGNOSIS — N393 Stress incontinence (female) (male): Secondary | ICD-10-CM | POA: Diagnosis not present

## 2017-10-29 DIAGNOSIS — Z01419 Encounter for gynecological examination (general) (routine) without abnormal findings: Secondary | ICD-10-CM | POA: Diagnosis not present

## 2017-10-29 DIAGNOSIS — Z780 Asymptomatic menopausal state: Secondary | ICD-10-CM

## 2017-10-29 DIAGNOSIS — Z13820 Encounter for screening for osteoporosis: Secondary | ICD-10-CM | POA: Diagnosis not present

## 2017-10-29 NOTE — Progress Notes (Signed)
[place] 1965/10/28 [id number]   History:    52 y.o. G1P1L1 Married.  Son is a Therapist, art in [employer].  RP:  Established patient presenting for annual gyn exam   HPI: Menopause x 2014, well on no HRT.  No PMB.  No pelvic pain.  Normal vaginal secretions.  No pain with intercourse.  Complains of mild to moderate SUI.  Incontinence of urine with coughing and laughing especially, but also with moderate physical activity.  Has been doing Kegel exercises, but not regularly.  Bowel movements normal, although sometimes finds a small amount of stool when wiping after micturition.  No rectal bleeding.  Breasts normal.  Body mass index 34.96.  Health labs with family physician.  Colonoscopy done in 2017.  Past medical history,surgical history, family history and social history were all reviewed and documented in the EPIC chart.  Gynecologic History Patient's last menstrual period was 04/09/2012. Contraception: post menopausal status Last Pap: 02/2015. Results were: Negative, HR HPV neg Last mammogram: 05/2017. Results were: Normal per patient Bone Density: 09/2013 normal Colonoscopy: 2017  Obstetric History OB History  Gravida Para Term Preterm AB Living  1       0 1  SAB TAB Ectopic Multiple Live Births      0        # Outcome Date GA Lbr Len/2nd Weight Sex Delivery Anes PTL Lv  1 Gravida              ROS: A ROS was performed and pertinent positives and negatives are included in the history.  GENERAL: No fevers or chills. HEENT: No change in vision, no earache, sore throat or sinus congestion. NECK: No pain or stiffness. CARDIOVASCULAR: No chest pain or pressure. No palpitations. PULMONARY: No shortness of breath, cough or wheeze. GASTROINTESTINAL: No abdominal pain, nausea, vomiting or diarrhea, melena or bright red blood per rectum. GENITOURINARY: No urinary frequency, urgency, hesitancy or dysuria. MUSCULOSKELETAL: No joint or muscle pain, no back pain, no recent trauma.  DERMATOLOGIC: No rash, no itching, no lesions. ENDOCRINE: No polyuria, polydipsia, no heat or cold intolerance. No recent change in weight. HEMATOLOGICAL: No anemia or easy bruising or bleeding. NEUROLOGIC: No headache, seizures, numbness, tingling or weakness. PSYCHIATRIC: No depression, no loss of interest in normal activity or change in sleep pattern.     Exam:   BP 124/80 (BP Location: Right Arm, Patient Position: Sitting, Cuff Size: Normal)   Ht 5\' 1"  (1.549 m)   Wt 185 lb (83.9 kg)   LMP 04/09/2012   BMI 34.96 kg/m   Body mass index is 34.96 kg/m.  General appearance : Well developed well nourished female. No acute distress HEENT: Eyes: no retinal hemorrhage or exudates,  Neck supple, trachea midline, no carotid bruits, no thyroidmegaly Lungs: Clear to auscultation, no rhonchi or wheezes, or rib retractions  Heart: Regular rate and rhythm, no murmurs or gallops Breast:Examined in sitting and supine position were symmetrical in appearance, no palpable masses or tenderness,  no skin retraction, no nipple inversion, no nipple discharge, no skin discoloration, no axillary or supraclavicular lymphadenopathy Abdomen: no palpable masses or tenderness, no rebound or guarding Extremities: no edema or skin discoloration or tenderness  Pelvic: Vulva: Normal             Vagina: No gross lesions or discharge  Cervix: No gross lesions or discharge.  Pap reflex done.  Uterus  AV, normal size, shape and consistency, non-tender and mobile  Adnexa  Without masses or  tenderness  Rectal exam normal   Assessment/Plan:  52 y.o. female for annual exam   1. Encounter for routine gynecological examination with Papanicolaou smear of cervix Normal gynecologic exam.  Pap reflex done.  Breast exam normal.  Will schedule screening mammogram in December 2019.  Colonoscopy normal in 2017.  Health labs with family physician.  2. Menopause present Well on no hormone replacement therapy.  No postmenopausal  bleeding.  3. SUI (stress urinary incontinence, female) Mild to moderate stress urinary incontinence.  Patient will increase Kegel exercises.  Instructions added to the summary.  Patient also referred to physical therapy to improve the strength of the pelvic floor.  Recommendation to avoid excess pelvic pressure discussed including avoiding heavy lifting, overfilling of the bladder, treating chronic cough and constipation.  4. Screening for osteoporosis Vitamin D supplements, calcium rich nutrition and regular weightbearing physical activity recommended.  Will follow up here for bone density. - DG Bone Density; Future  Counseling on above issues and coordination of care more than 50% for 10 minutes.  Princess Bruins MD, 11:52 AM 10/29/2017

## 2017-10-29 NOTE — Patient Instructions (Signed)
1. Encounter for routine gynecological examination with Papanicolaou smear of cervix Normal gynecologic exam.  Pap reflex done.  Breast exam normal.  Will schedule screening mammogram in December 2019.  Colonoscopy normal in 2017.  Health labs with family physician.  2. Menopause present Well on no hormone replacement therapy.  No postmenopausal bleeding.  3. SUI (stress urinary incontinence, female) Mild to moderate stress urinary incontinence.  Patient will increase Kegel exercises.  Instructions added to the summary.  Patient also referred to physical therapy to improve the strength of the pelvic floor.  Recommendation to avoid excess pelvic pressure discussed including avoiding heavy lifting, overfilling of the bladder, treating chronic cough and constipation.  4. Screening for osteoporosis Vitamin D supplements, calcium rich nutrition and regular weightbearing physical activity recommended.  Will follow up here for bone density. - DG Bone Density; Future  [name], it was a pleasure seeing you today!  I will inform you of your results as soon as they are available.   Kegel Exercises Kegel exercises help strengthen the muscles that support the rectum, vagina, small intestine, bladder, and uterus. Doing Kegel exercises can help:  Improve bladder and bowel control.  Improve sexual response.  Reduce problems and discomfort during pregnancy.  Kegel exercises involve squeezing your pelvic floor muscles, which are the same muscles you squeeze when you try to stop the flow of urine. The exercises can be done while sitting, standing, or lying down, but it is best to vary your position. Phase 1 exercises 1. Squeeze your pelvic floor muscles tight. You should feel a tight lift in your rectal area. If you are a female, you should also feel a tightness in your vaginal area. Keep your stomach, buttocks, and legs relaxed. 2. Hold the muscles tight for up to 10 seconds. 3. Relax your muscles. Repeat  this exercise 50 times a day or as many times as told by your health care provider. Continue to do this exercise for at least 4-6 weeks or for as long as told by your health care provider. This information is not intended to replace advice given to you by your health care provider. Make sure you discuss any questions you have with your health care provider. Document Released: 05/12/2012 Document Revised: 01/19/2016 Document Reviewed: 04/15/2015 Elsevier Interactive Patient Education  [employer].

## 2017-10-30 LAB — PAP IG W/ RFLX HPV ASCU

## 2017-11-03 ENCOUNTER — Other Ambulatory Visit: Payer: Self-pay | Admitting: Gynecology

## 2017-11-03 ENCOUNTER — Telehealth: Payer: Self-pay | Admitting: *Deleted

## 2017-11-03 DIAGNOSIS — Z13820 Encounter for screening for osteoporosis: Secondary | ICD-10-CM

## 2017-11-03 DIAGNOSIS — N393 Stress incontinence (female) (male): Secondary | ICD-10-CM

## 2017-11-03 NOTE — Telephone Encounter (Signed)
-----   Message from Princess Bruins, MD sent at 10/29/2017 12:12 PM EDT ----- Regarding: Refer to Physical Therapy SUI for Pelvic floor PT.

## 2017-11-03 NOTE — Telephone Encounter (Signed)
Referral placed in epic at [facility] they will call to schedule

## 2017-11-07 DIAGNOSIS — M8580 Other specified disorders of bone density and structure, unspecified site: Secondary | ICD-10-CM

## 2017-11-07 HISTORY — DX: Other specified disorders of bone density and structure, unspecified site: M85.80

## 2017-11-12 ENCOUNTER — Encounter: Payer: Self-pay | Admitting: Physical Therapy

## 2017-11-12 ENCOUNTER — Other Ambulatory Visit: Payer: Self-pay

## 2017-11-12 ENCOUNTER — Ambulatory Visit: Payer: BLUE CROSS/BLUE SHIELD | Attending: Obstetrics & Gynecology | Admitting: Physical Therapy

## 2017-11-12 DIAGNOSIS — M6281 Muscle weakness (generalized): Secondary | ICD-10-CM | POA: Insufficient documentation

## 2017-11-12 DIAGNOSIS — R279 Unspecified lack of coordination: Secondary | ICD-10-CM | POA: Insufficient documentation

## 2017-11-12 NOTE — Patient Instructions (Signed)
STRETCHING THE PELVIC FLOOR MUSCLES NO DILATOR  Supplies . Vaginal lubricant . Mirror (optional) . Gloves (optional) Positioning . Start in a semi-reclined position with your head propped up. Bend your knees and place your thumb or finger at the vaginal opening. Procedure . Apply a moderate amount of lubricant on the outer skin of your vagina, the labia minora.  Apply additional lubricant to your finger. Marland Kitchen Spread the skin away from the vaginal opening. Place the end of your finger at the opening. . Do a maximum contraction of the pelvic floor muscles. Tighten the vagina and the anus maximally and relax. . When you know they are relaxed, gently and slowly insert your finger into your vagina, directing your finger slightly downward, for 2-3 inches of insertion. . Relax and stretch the 6 o'clock position . Hold each stretch for _2 min__ and repeat __1_ time with rest breaks of _1__ seconds between each stretch. . Repeat the stretching in the 4 o'clock and 8 o'clock positions. . Total time should be _6__ minutes, _1__ x per day.  Note the amount of theme your were able to achieve and your tolerance to your finger in your vagina. . Once you have accomplished the techniques you may try them in standing with one foot resting on the tub, or in other positions.  This is a good stretch to do in the shower if you don't need to use lubricant.  Moisturizers . They are used in the vagina to hydrate the mucous membrane that make up the vaginal canal. . Designed to keep a more normal acid balance (ph) . Once placed in the vagina, it will last between two to three days.  . Use 2-3 times per week at bedtime and last longer than 60 min. . Ingredients to avoid is glycerin and fragrance, can increase chance of infection . Should not be used just before sex due to causing irritation . Most are gels administered either in a tampon-shaped applicator or as a vaginal suppository. They are non-hormonal.   Types of  Moisturizers . Samul Dada- drug store . Vitamin E vaginal suppositories- Whole foods, Amazon . Moist Again . Coconut oil- can break down condoms . Michail Jewels . Yes moisturizer- amazon . NeuEve Silk , NeuEve Silver for menopausal or over 65 (if have severe vaginal atrophy or cancer treatments use NeuEve Silk for  1 month than move to The Pepsi)- [employer], MapleFlower.dk . Olive and Bee intimate cream- www.oliveandbee.com.au  Creams to use externally on the Vulva area  Albertson's (good for for cancer patients that had radiation to the area)- [place] or [employer].FlyingBasics.com.br  V-magic cream - amazon  Julva-amazon  Vital "V [place] salve ( help moisturize and help with thinning vulvar area, does have [place]   Things to avoid in the vaginal area . Do not use things to irritate the vulvar area . No lotions just specialized creams for the vulva area- Neogyn, V-magic, No soaps; can use Aveeno or Calendula cleanser if needed. Must be gentle . No deodorants . No douches . Good to sleep without underwear to let the vaginal area to air out . No scrubbing: spread the lips to let warm water rinse over labias and pat dry  Lubrication . Used for intercourse to reduce friction . Avoid ones that have glycerin, warming gels, tingling gels, icing or cooling gel, scented . Avoid parabens due to a preservative similar to female sex  hormone . May need to be reapplied once or several times during sexual activity . Can be applied to both partners genitals prior to vaginal penetration to minimize friction or irritation . Prevent irritation and mucosal tears that cause post coital pain and increased the risk of vaginal and urinary tract infections . Oil-based lubricants cannot be used with condoms due to breaking them down.  Least likely to irritate vaginal tissue.  . Plant based-lubes are safe . Silicone-based  lubrication are thicker and last long and used for post-menopausal women  Vaginal Lubricators Here is a list of some suggested lubricators you can use for intercourse. Use the most hypoallergenic product.  You can place on you or your partner.   Slippery Stuff  Sylk or Sliquid Natural H2O ( good  if frequent UTI's)  Blossom Organics (www.blossom-organics.com)  Luvena   Coconut oil  PJur Woman Nude- water based lubricant, amazon  Uberlube- Amazon  Aloe Vera  Yes lubricant- [employer] Platinum-Silicone, Target, Walgreens  Olive and Bee intimate cream-  www.oliveandbee.com.au Things to avoid in lubricants are glycerin, warming gels, tingling gels, icing or cooling  gels, and scented gels.  Also avoid Vaseline. KY jelly, Replens, and Astroglide kills good bacteria(lactobacilli)  Things to avoid in the vaginal area . Do not use things to irritate the vulvar area . No lotions- see below . No soaps; can use Aveeno or Calendula cleanser if needed. Must be gentle . No deodorants . No douches . Good to sleep without underwear to let the vaginal area to air out . No scrubbing: spread the lips to let warm water rinse over labias and pat dry  Creams that can be used on the [place] Releveum or [facility] [address] [place], [place] [address] Phone # [phone] Fax [phone]

## 2017-11-12 NOTE — Therapy (Signed)
[facility] [address], [place], [place], [address] Phone: [phone]   Fax:  [phone]  Physical Therapy Evaluation  Patient Details  Name: [name] MRN: [id number] Date of Birth: [birth year] Referring Provider: Dr. Cala Bradford   Encounter Date: [birth year]  PT End of Session - 11/12/17 1305    Visit Number  1    Date for PT Re-Evaluation  02/04/18    Authorization Type  BCBS    PT Start Time  1230    PT Stop Time  1310    PT Time Calculation (min)  40 min    Activity Tolerance  Patient tolerated treatment well    Behavior During Therapy  [facility] for tasks assessed/performed       Past Medical History:  Diagnosis Date  . DJD (degenerative joint disease)    knees and ankles  . Hematuria   . History of endometriosis   . History of kidney stones   . Hyperlipidemia   . Hypertension   . Migraines   . Pericarditis 04/27/2017  . PONV (postoperative nausea and vomiting)   . Right ureteral stone   . Wears contact lenses     Past Surgical History:  Procedure Laterality Date  . CYSTOSCOPY WITH RETROGRADE PYELOGRAM, URETEROSCOPY AND STENT PLACEMENT Right 10/04/2014   Procedure: CYSTOSCOPY WITH RETROGRADE PYELOGRAM, URETEROSCOPY ;  Surgeon: Rana Snare, MD;  Location: [facility];  Service: Urology;  Laterality: Right;  . DILATION AND EVACUATION  1993  . HOLMIUM LASER APPLICATION Right 7/62/8315   Procedure: HOLMIUM LASER APPLICATION;  Surgeon: Rana Snare, MD;  Location: [facility];  Service: Urology;  Laterality: Right;  . LAPAROSCOPY LEFT OVARIAN CYSTECTOMY  06-17-2007   and Partial Left Salpingectomy  . LAPAROSCOPY RIGHT TUBAL LIGATION/  REMOVAL IUD/  HYSTEROSCOPY GYNECARE ENDOMETRIAL BALLOON ABLATION  07-14-2003  . RIGHT URETEROSCPIC LASER LITHOTRIPSY STONE EXTRACTION  02-15-2007    There were no vitals filed for this visit.   Subjective Assessment - 11/12/17  1235    Subjective  Stress incontinence started 1 year ago.  Patient is not consistent with kegel exercise.  I have alot of tearing from child birth.     Patient Stated Goals  decrease urine leakage; pelvic floor health and strength    Currently in Pain?  Yes    Pain Score  5     Pain Location  Vagina    Pain Orientation  Mid    Pain Descriptors / Indicators  Grimacing    Pain Type  Acute pain    Pain Onset  More than a month ago    Pain Frequency  Intermittent    Aggravating Factors   intercourse at penetration    Pain Relieving Factors  no intercourse    Multiple Pain Sites  No         [facility] PT Assessment - 11/12/17 0001      Assessment   Medical Diagnosis  N39.3 Stress incontinence    Referring Provider  Dr. Cala Bradford    Onset Date/Surgical Date  11/12/16    Prior Therapy  none      Precautions   Precautions  None      Restrictions   Weight Bearing Restrictions  No      Balance Screen   Has the patient fallen in the past 6 months  Yes    How many times?  1 collided with her dog    Has the patient  had a decrease in activity level because of a fear of falling?   No    Is the patient reluctant to leave their home because of a fear of falling?   No      Home Film/video editor residence      Prior Function   Level of Independence  Independent    Vocation  Full time employment    Vocation Requirements  sitting    Leisure  yoga      Cognition   Overall Cognitive Status  Within Functional Limits for tasks assessed      Posture/Postural Control   Posture/Postural Control  No significant limitations      ROM / Strength   AROM / PROM / Strength  AROM;PROM;Strength      AROM   Overall AROM   Within functional limits for tasks performed      Strength   Overall Strength Comments  abdominal strength is 2/5    Right Hip ABduction  4/5    Left Hip Extension  4/5    Left Hip ABduction  4/5                Objective  measurements completed on examination: See above findings.    Pelvic Floor Special Questions - 11/12/17 0001    Prior Pregnancies  Yes    Number of Pregnancies  2    Number of Vaginal Deliveries  1    Any difficulty with labor and deliveries  Yes tearing    Currently Sexually Active  Yes    Is this Painful  Yes    Urinary Leakage  Yes    Activities that cause leaking  Sneezing;Laughing;Coughing;With strong urge    Urinary urgency  Yes sometimes    Fecal incontinence  Yes wiping after sees stool    Skin Integrity  Intact dryness    Pelvic Floor Internal Exam  Patient confirms identification and approves PT to assess pelvic floor muscles and treatment    Exam Type  Vaginal    Palpation  tightness located in the posterior introitus and on the sides, decreased mobiltiy of the introitus, tenderness     Strength  weak squeeze, no lift no cirucular contraction    Tone  increased               PT Education - 11/12/17 1305    Education Details  information on vaginal lubricants and moisturizers; perineal massage    Person(s) Educated  Patient    Methods  Explanation;Handout    Comprehension  Verbalized understanding       PT Short Term Goals - 11/12/17 1316      PT SHORT TERM GOAL #1   Title  independent with initial HEP    Time  4    Period  Weeks    Status  New    Target Date  12/10/17      PT SHORT TERM GOAL #2   Title  understand about moisturizers and lubricants to improve vaginal health    Time  4    Period  Weeks    Status  New    Target Date  12/10/17      PT SHORT TERM GOAL #3   Title  understand how to toilet correctly to relax the pelvic floor    Time  4    Period  Weeks    Status  New    Target Date  12/10/17  PT Long Term Goals - 11/12/17 1319      PT LONG TERM GOAL #1   Title  independent with HEP and understand how to progress herself    Time  12    Period  Weeks    Status  New    Target Date  02/04/18      PT LONG TERM GOAL #2    Title  urinary leakage with sneezing, laughing, and coughing decreased >/= 75% due to strength >/= 4/5    Time  12    Period  Weeks    Status  New    Target Date  02/04/18      PT LONG TERM GOAL #3   Title  able to wipe her anal area without still having stool on toilet paper due to improve pelvic floor strength    Time  12    Period  Weeks    Status  New    Target Date  02/04/18      PT LONG TERM GOAL #4   Title  pain with intercourse </= 75% due to improved tissue mobility vaginally    Time  12    Period  Weeks    Status  New    Target Date  02/04/18             Plan - 11/12/17 1307    Clinical Impression Statement  Patient is a 52 year old female with stress incontinence for 1 year.  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 Patient has had tears when she had her child. Patient has urinary leakage with laughing, coughing, sneezing and urges. Patient reports vaginal dryness. Patient has decreased mobility of perineal body due to scar from tearing.  Tenderness located all sides of intoitus, obturator internist, and levator ani.  Decreased mobility of introitus. Pelvic floor strength 2/5 and does not have a circular contraction. Bilateral hip abduction strength 4/5.  Abdominal strength 2/5.  Patient will benefit from skilled therapy to improve pelvic floor coordination and circular contraction to reduce urinary leakage while improving vaginal health.     History and Personal Factors relevant to plan of care:  Fibromyalgis; endometriosis; Laproscopic surgery    Clinical Presentation  Stable    Clinical Presentation due to:  stable condition    Clinical Decision Making  Low    Rehab Potential  Excellent    Clinical Impairments Affecting Rehab Potential  Fibromyalgis; endometriosis; Laproscopic surgery    PT Frequency  1x / week    PT Duration  12 weeks    PT Treatment/Interventions  Biofeedback;Cryotherapy;Electrical Stimulation;Moist Heat;Therapeutic  exercise;Therapeutic activities;Neuromuscular re-education;Patient/family education;Scar mobilization;Manual techniques;Passive range of motion;Dry needling    PT Next Visit Plan  soft tissue work to perineum; hip stretches; abdominal bracing; review about moisturizers and lubricants    PT Home Exercise Plan  progress as needed    Consulted and Agree with Plan of Care  Patient       Patient will benefit from skilled therapeutic intervention in order to improve the following deficits and impairments:  Increased fascial restricitons, Pain, Decreased scar mobility, Decreased coordination, Decreased mobility, Increased muscle spasms, Impaired tone, Decreased strength, Decreased range of motion, Decreased activity tolerance  Visit Diagnosis: Muscle weakness (generalized) - Plan: PT plan of care cert/re-cert  Unspecified lack of coordination - Plan: PT plan of care cert/re-cert     Problem List Patient Active Problem List   Diagnosis Date Noted  . Pericarditis 04/27/2017  . Edema extremities  04/27/2017  . Familial hypercholesterolemia 07/16/2015  . Migraine with aura and without status migrainosus, not intractable 07/16/2015  . Ureteral calculi 10/04/2014  . Essential hypertension 07/12/2014    Earlie Counts, PT 11/12/17 1:23 PM   [facility] [address], [place], [place], [address] Phone: [phone]   Fax:  [phone]  Name: ICESIS RENN MRN: [id number] Date of Birth: 04-04-1966

## 2017-11-16 ENCOUNTER — Ambulatory Visit (INDEPENDENT_AMBULATORY_CARE_PROVIDER_SITE_OTHER): Payer: BLUE CROSS/BLUE SHIELD

## 2017-11-16 ENCOUNTER — Other Ambulatory Visit: Payer: Self-pay | Admitting: Gynecology

## 2017-11-16 ENCOUNTER — Telehealth: Payer: Self-pay | Admitting: Gynecology

## 2017-11-16 ENCOUNTER — Encounter: Payer: Self-pay | Admitting: Gynecology

## 2017-11-16 DIAGNOSIS — Z13820 Encounter for screening for osteoporosis: Secondary | ICD-10-CM | POA: Diagnosis not present

## 2017-11-16 DIAGNOSIS — M8589 Other specified disorders of bone density and structure, multiple sites: Secondary | ICD-10-CM

## 2017-11-16 NOTE — Telephone Encounter (Signed)
patient scheduled on 11/19/17

## 2017-11-16 NOTE — Telephone Encounter (Signed)
Tell patient her most recent bone density does show osteopenia which is some loss of calcium from the bones but not to the extent of osteoporosis.  I would recommend having a vitamin D level checked to make sure she is in the therapeutic range.  I would recommend weightbearing exercise on a regular basis such as walking.  Adequate calcium intake daily at 1200 mg total also is important.  I would recommend repeating the bone density in 2 years.

## 2017-11-17 NOTE — Telephone Encounter (Signed)
Left message for pt to call.

## 2017-11-19 ENCOUNTER — Encounter: Payer: BLUE CROSS/BLUE SHIELD | Admitting: Physical Therapy

## 2017-11-24 ENCOUNTER — Encounter: Payer: BLUE CROSS/BLUE SHIELD | Admitting: Physical Therapy

## 2017-12-01 ENCOUNTER — Ambulatory Visit: Payer: BLUE CROSS/BLUE SHIELD | Admitting: Physical Therapy

## 2017-12-01 ENCOUNTER — Encounter: Payer: Self-pay | Admitting: Physical Therapy

## 2017-12-01 DIAGNOSIS — M6281 Muscle weakness (generalized): Secondary | ICD-10-CM

## 2017-12-01 DIAGNOSIS — R279 Unspecified lack of coordination: Secondary | ICD-10-CM

## 2017-12-01 NOTE — Therapy (Addendum)
[facility] [address], [place], [place], [address] Phone: [phone]   Fax:  [phone]  Physical Therapy Treatment  Patient Details  Name: [name] MRN: [id number] Date of Birth: 1966/01/12 Referring Provider: Dr. Cala Bradford   Encounter Date: 12/01/2017  PT End of Session - 12/01/17 1316    Visit Number  2    Date for PT Re-Evaluation  02/04/18    Authorization Type  [employer]    Authorization - Visit Number  2    Authorization - Number of Visits  30    PT Start Time  1230    PT Stop Time  1310    PT Time Calculation (min)  40 min    Activity Tolerance  Patient tolerated treatment well    Behavior During Therapy  [facility] for tasks assessed/performed       Past Medical History:  Diagnosis Date  . DJD (degenerative joint disease)    knees and ankles  . Hematuria   . History of endometriosis   . History of kidney stones   . Hyperlipidemia   . Hypertension   . Migraines   . Osteopenia 11/2017   T score -2.0 FRAX 6.3% / 0.8%  . Pericarditis 04/27/2017  . PONV (postoperative nausea and vomiting)   . Right ureteral stone   . Wears contact lenses     Past Surgical History:  Procedure Laterality Date  . CYSTOSCOPY WITH RETROGRADE PYELOGRAM, URETEROSCOPY AND STENT PLACEMENT Right 10/04/2014   Procedure: CYSTOSCOPY WITH RETROGRADE PYELOGRAM, URETEROSCOPY ;  Surgeon: Rana Snare, MD;  Location: [facility];  Service: Urology;  Laterality: Right;  . DILATION AND EVACUATION  1993  . HOLMIUM LASER APPLICATION Right 2/99/3716   Procedure: HOLMIUM LASER APPLICATION;  Surgeon: Rana Snare, MD;  Location: [facility];  Service: Urology;  Laterality: Right;  . LAPAROSCOPY LEFT OVARIAN CYSTECTOMY  06-17-2007   and Partial Left Salpingectomy  . LAPAROSCOPY RIGHT TUBAL LIGATION/  REMOVAL IUD/  HYSTEROSCOPY GYNECARE ENDOMETRIAL BALLOON ABLATION  07-14-2003  . RIGHT URETEROSCPIC  LASER LITHOTRIPSY STONE EXTRACTION  02-15-2007    There were no vitals filed for this visit.  Subjective Assessment - 12/01/17 1234    Subjective  I am doing better. I have not been the best to exercise.     Patient Stated Goals  decrease urine leakage; pelvic floor health and strength    Currently in Pain?  Yes    Pain Score  5     Pain Location  Vagina    Pain Orientation  Mid    Pain Descriptors / Indicators  Grimacing    Pain Type  Acute pain    Pain Onset  More than a month ago    Pain Frequency  Intermittent    Aggravating Factors   intercourse at penetration    Pain Relieving Factors  no intercourse    Multiple Pain Sites  No                    Pelvic Floor Special Questions - 12/01/17 0001    Pelvic Floor Internal Exam  Patient confirms identification and approves PT to assess pelvic floor muscles and treatment    Exam Type  Vaginal    Strength  weak squeeze, no lift    Strength # of seconds  3        [facility] Adult PT Treatment/Exercise - 12/01/17 0001      Therapeutic Activites  Therapeutic Activities  Other Therapeutic Activities    Other Therapeutic Activities  instructed on correct toileting technique             PT Education - 12/01/17 1312    Education Details  Access Code: [email or web address] ; toileting technique    Person(s) Educated  Patient    Methods  Explanation;Demonstration;Verbal cues;Handout    Comprehension  Returned demonstration;Verbalized understanding       PT Short Term Goals - 12/01/17 1236      PT SHORT TERM GOAL #1   Title  independent with initial HEP    Time  4    Period  Weeks    Status  On-going      PT SHORT TERM GOAL #2   Title  understand about moisturizers and lubricants to improve vaginal health    Time  4    Period  Weeks    Status  Achieved      PT SHORT TERM GOAL #3   Title  understand how to toilet correctly to relax the pelvic floor    Time  4    Period  Weeks    Status  On-going        PT  Long Term Goals - 11/12/17 1319      PT LONG TERM GOAL #1   Title  independent with HEP and understand how to progress herself    Time  12    Period  Weeks    Status  New    Target Date  02/04/18      PT LONG TERM GOAL #2   Title  urinary leakage with sneezing, laughing, and coughing decreased >/= 75% due to strength >/= 4/5    Time  12    Period  Weeks    Status  New    Target Date  02/04/18      PT LONG TERM GOAL #3   Title  able to wipe her anal area without still having stool on toilet paper due to improve pelvic floor strength    Time  12    Period  Weeks    Status  New    Target Date  02/04/18      PT LONG TERM GOAL #4   Title  pain with intercourse </= 75% due to improved tissue mobility vaginally    Time  12    Period  Weeks    Status  New    Target Date  02/04/18            Plan - 12/01/17 1235    Clinical Impression Statement  Patient understands how to use vaginal moisturizers and lubricants.  Patient was educated on toileting technique. Patient has not been consistent with her pelvic floor exercises.  After manual work patient had increased circular contraction of the pelvic floor but unable to hold longer than 3 sec and trouble fully relaxing.  Patient will benetit from skilled therapy to improve pelvic floor coordination and circular contraction to reduce urinary leakage while improving vaginal health.     Rehab Potential  Excellent    Clinical Impairments Affecting Rehab Potential  Fibromyalgis; endometriosis; Laproscopic surgery    PT Frequency  1x / week    PT Duration  12 weeks    PT Treatment/Interventions  Biofeedback;Cryotherapy;Electrical Stimulation;Moist Heat;Therapeutic exercise;Therapeutic activities;Neuromuscular re-education;Patient/family education;Scar mobilization;Manual techniques;Passive range of motion;Dry needling    PT Next Visit Plan  soft tissue work to perineum;  abdominal bracing; pelvic floor EMG  PT Home Exercise Plan  Access  Code: [email or web address]     Recommended Other Services  MD signed the initial order    Consulted and Agree with Plan of Care  Patient       Patient will benefit from skilled therapeutic intervention in order to improve the following deficits and impairments:  Increased fascial restricitons, Pain, Decreased scar mobility, Decreased coordination, Decreased mobility, Increased muscle spasms, Impaired tone, Decreased strength, Decreased range of motion, Decreased activity tolerance  Visit Diagnosis: Muscle weakness (generalized)  Unspecified lack of coordination     Problem List Patient Active Problem List   Diagnosis Date Noted  . Pericarditis 04/27/2017  . Edema extremities 04/27/2017  . Familial hypercholesterolemia 07/16/2015  . Migraine with aura and without status migrainosus, not intractable 07/16/2015  . Ureteral calculi 10/04/2014  . Essential hypertension 07/12/2014    Earlie Counts, PT 12/01/17 1:18 PM   [facility] [address] [place], [place], [address] Phone: [phone]   Fax:  [phone]  Name: [name] MRN: [id number] Date of Birth: [birth year]  PHYSICAL THERAPY DISCHARGE SUMMARY  Visits from Start of Care: 2  Current functional level related to goals / functional outcomes: See above.   Remaining deficits: See above. Patient has not returned since her last visit and has not called to reschedule.    Education / Equipment: HEP Plan:                                                    Patient goals were not met. Patient is being discharged due to not returning since the last visit.  Thank you for the referral. Earlie Counts, PT 02/03/18 12:16 PM  ?????

## 2017-12-01 NOTE — Patient Instructions (Addendum)
Toileting Techniques for Bowel Movements (Defecation) Using your belly (abdomen) and pelvic floor muscles to have a bowel movement is usually instinctive.  Sometimes people can have problems with these muscles and have to relearn proper defecation (emptying) techniques.  If you have weakness in your muscles, organs that are falling out, decreased sensation in your pelvis, or ignore your urge to go, you may find yourself straining to have a bowel movement.  You are straining if you are: . holding your breath or taking in a huge gulp of air and holding it  . keeping your lips and jaw tensed and closed tightly . turning red in the face because of excessive pushing or forcing . developing or worsening your  hemorrhoids . getting faint while pushing . not emptying completely and have to defecate many times a day  If you are straining, you are actually making it harder for yourself to have a bowel movement.  Many people find they are pulling up with the pelvic floor muscles and closing off instead of opening the anus. Due to lack pelvic floor relaxation and coordination the abdominal muscles, one has to work harder to push the feces out.  Many people have never been taught how to defecate efficiently and effectively.  Notice what happens to your body when you are having a bowel movement.  While you are sitting on the toilet pay attention to the following areas: . Jaw and mouth position . Angle of your hips   . Whether your feet touch the ground or not . Arm placement  . Spine position . Waist . Belly tension . Anus (opening of the anal canal)  An Evacuation/Defecation Plan   Here are the 4 basic points:  1. Lean forward enough for your elbows to rest on your knees 2. Support your feet on the floor or use a low stool if your feet don't touch the floor  3. Push out your belly as if you have swallowed a beach ball-you should feel a widening of your waist 4. Open and relax your pelvic floor muscles,  rather than tightening around the anus      The following conditions my require modifications to your toileting posture:  . If you have had surgery in the past that limits your back, hip, pelvic, knee or ankle flexibility . Constipation   Your healthcare practitioner may make the following additional suggestions and adjustments:  1) Sit on the toilet  a) Make sure your feet are supported. b) Notice your hip angle and spine position-most people find it effective to lean forward or raise their knees, which can help the muscles around the anus to relax  c) When you lean forward, place your forearms on your thighs for support  2) Relax suggestions a) Breath deeply in through your nose and out slowly through your mouth as if you are smelling the flowers and blowing out the candles. b) To become aware of how to relax your muscles, contracting and releasing muscles can be helpful.  Pull your pelvic floor muscles in tightly by using the image of holding back gas, or closing around the anus (visualize making a circle smaller) and lifting the anus up and in.  Then release the muscles and your anus should drop down and feel open. Repeat 5 times ending with the feeling of relaxation. c) Keep your pelvic floor muscles relaxed; let your belly bulge out. d) The digestive tract starts at the mouth and ends at the anal opening, so be   sure to relax both ends of the tube.  Place your tongue on the roof of your mouth with your teeth separated.  This helps relax your mouth and will help to relax the anus at the same time.  3) Empty (defecation) a) Keep your pelvic floor and sphincter relaxed, then bulge your anal muscles.  Make the anal opening wide.  b) Stick your belly out as if you have swallowed a beach ball. c) Make your belly wall hard using your belly muscles while continuing to breathe. Doing this makes it easier to open your anus. d) Breath out and give a grunt (or try using other sounds such as  ahhhh, shhhhh, ohhhh or grrrrrrr).  4) Finish a) As you finish your bowel movement, pull the pelvic floor muscles up and in.  This will leave your anus in the proper place rather than remaining pushed out and down. If you leave your anus pushed out and down, it will start to feel as though that is normal and give you incorrect signals about needing to have a bowel movement.  [email or web address]    Earlie Counts, PT [facility] [address] [place], [place] [address] [facility] [address] [place], [place] [address] Phone # [phone] Fax [phone]

## 2017-12-09 ENCOUNTER — Encounter: Payer: BLUE CROSS/BLUE SHIELD | Admitting: Physical Therapy

## 2017-12-15 NOTE — Telephone Encounter (Signed)
Left message for pt to call.

## 2017-12-22 ENCOUNTER — Other Ambulatory Visit: Payer: Self-pay | Admitting: *Deleted

## 2017-12-22 DIAGNOSIS — M8580 Other specified disorders of bone density and structure, unspecified site: Secondary | ICD-10-CM

## 2017-12-22 NOTE — Telephone Encounter (Signed)
I left detailed message on voicemail per DPR access, order placed for vitamin d level

## 2017-12-23 ENCOUNTER — Encounter: Payer: BLUE CROSS/BLUE SHIELD | Admitting: Physical Therapy

## 2018-01-07 ENCOUNTER — Ambulatory Visit: Payer: BLUE CROSS/BLUE SHIELD | Attending: Obstetrics & Gynecology | Admitting: Physical Therapy

## 2018-01-07 ENCOUNTER — Telehealth: Payer: Self-pay | Admitting: Physical Therapy

## 2018-01-07 NOTE — Telephone Encounter (Signed)
Called patient about no show appointment at 12:30 PM.  Left a message.  Earlie Counts, PT @8 /06/2017@ 12:44 PM

## 2018-01-14 ENCOUNTER — Other Ambulatory Visit: Payer: BLUE CROSS/BLUE SHIELD

## 2018-01-14 DIAGNOSIS — M8580 Other specified disorders of bone density and structure, unspecified site: Secondary | ICD-10-CM

## 2018-01-15 LAB — VITAMIN D 25 HYDROXY (VIT D DEFICIENCY, FRACTURES): Vit D, 25-Hydroxy: 30 ng/mL (ref 30–100)

## 2018-03-23 DIAGNOSIS — N200 Calculus of kidney: Secondary | ICD-10-CM | POA: Insufficient documentation

## 2018-08-26 ENCOUNTER — Ambulatory Visit: Payer: Self-pay | Admitting: Cardiology

## 2018-09-30 ENCOUNTER — Ambulatory Visit: Payer: Self-pay | Admitting: Cardiology

## 2018-10-21 DIAGNOSIS — F329 Major depressive disorder, single episode, unspecified: Secondary | ICD-10-CM

## 2018-10-21 HISTORY — DX: Major depressive disorder, single episode, unspecified: F32.9

## 2018-10-27 ENCOUNTER — Other Ambulatory Visit: Payer: Self-pay

## 2018-11-02 ENCOUNTER — Ambulatory Visit (INDEPENDENT_AMBULATORY_CARE_PROVIDER_SITE_OTHER): Payer: BLUE CROSS/BLUE SHIELD | Admitting: Obstetrics & Gynecology

## 2018-11-02 ENCOUNTER — Encounter: Payer: Self-pay | Admitting: Obstetrics & Gynecology

## 2018-11-02 ENCOUNTER — Other Ambulatory Visit: Payer: Self-pay

## 2018-11-02 VITALS — BP 124/80 | Ht 60.0 in | Wt 187.0 lb

## 2018-11-02 DIAGNOSIS — E6609 Other obesity due to excess calories: Secondary | ICD-10-CM | POA: Diagnosis not present

## 2018-11-02 DIAGNOSIS — Z01419 Encounter for gynecological examination (general) (routine) without abnormal findings: Secondary | ICD-10-CM | POA: Diagnosis not present

## 2018-11-02 DIAGNOSIS — Z6836 Body mass index (BMI) 36.0-36.9, adult: Secondary | ICD-10-CM | POA: Diagnosis not present

## 2018-11-02 DIAGNOSIS — Z780 Asymptomatic menopausal state: Secondary | ICD-10-CM | POA: Diagnosis not present

## 2018-11-02 NOTE — Progress Notes (Signed)
[place] Dec 26, 1965 [id number]   History:    53 y.o. G1P1L1 Married.  Son senior in [employer].    RP:  Established patient presenting for annual gyn exam   HPI: Menopause, well on no HRT.  No PMB.  No pelvic pain.  No pain with IC.  Fibromyalgia worse x 07/2018, not working.  Urine/BMs normal.  Breasts normal.  Health labs with Fam MD.  Past medical history,surgical history, family history and social history were all reviewed and documented in the EPIC chart.  Gynecologic History Patient's last menstrual period was 04/09/2012. Contraception: post menopausal status Last Pap: 10/2017. Results were: Negative/HPV HR neg Last mammogram: 07/2018. Results were: normal, will obtain report from [facility] Bone Density: 11/2017 Osteopenia Colonoscopy: 2016  Obstetric History OB History  Gravida Para Term Preterm AB Living  1       0 1  SAB TAB Ectopic Multiple Live Births      0        # Outcome Date GA Lbr Len/2nd Weight Sex Delivery Anes PTL Lv  1 Gravida              ROS: A ROS was performed and pertinent positives and negatives are included in the history.  GENERAL: No fevers or chills. HEENT: No change in vision, no earache, sore throat or sinus congestion. NECK: No pain or stiffness. CARDIOVASCULAR: No chest pain or pressure. No palpitations. PULMONARY: No shortness of breath, cough or wheeze. GASTROINTESTINAL: No abdominal pain, nausea, vomiting or diarrhea, melena or bright red blood per rectum. GENITOURINARY: No urinary frequency, urgency, hesitancy or dysuria. MUSCULOSKELETAL: No joint or muscle pain, no back pain, no recent trauma. DERMATOLOGIC: No rash, no itching, no lesions. ENDOCRINE: No polyuria, polydipsia, no heat or cold intolerance. No recent change in weight. HEMATOLOGICAL: No anemia or easy bruising or bleeding. NEUROLOGIC: No headache, seizures, numbness, tingling or weakness. PSYCHIATRIC: No depression, no loss of interest in normal activity or change in sleep pattern.     Exam:   BP 124/80   Ht 5' (1.524 m)   Wt 187 lb (84.8 kg)   LMP 04/09/2012   BMI 36.52 kg/m   Body mass index is 36.52 kg/m.  General appearance : Well developed well nourished female. No acute distress HEENT: Eyes: no retinal hemorrhage or exudates,  Neck supple, trachea midline, no carotid bruits, no thyroidmegaly Lungs: Clear to auscultation, no rhonchi or wheezes, or rib retractions  Heart: Regular rate and rhythm, no murmurs or gallops Breast:Examined in sitting and supine position were symmetrical in appearance, no palpable masses or tenderness,  no skin retraction, no nipple inversion, no nipple discharge, no skin discoloration, no axillary or supraclavicular lymphadenopathy Abdomen: no palpable masses or tenderness, no rebound or guarding Extremities: no edema or skin discoloration or tenderness  Pelvic: Vulva: Normal             Vagina: No gross lesions or discharge  Cervix: No gross lesions or discharge  Uterus  AV, normal size, shape and consistency, non-tender and mobile  Adnexa  Without masses or tenderness  Anus: Normal   Assessment/Plan:  53 y.o. female for annual exam   1. Well female exam with routine gynecological exam Normal gynecologic exam and menopause.  Last Pap test May 2019 was negative with negative high-risk HPV, no indication to repeat this year.  Breast exam normal.  Last screening mammogram in February 2020 was normal per patient, will obtain report from [facility].  Health labs with family  physician.  Last colonoscopy 2016.  2. Postmenopause Well on no hormone replacement therapy.  No postmenopausal bleeding.  3. Class 2 obesity due to excess calories without serious comorbidity with body mass index (BMI) of 36.0 to 36.9 in adult Recommend a lower calorie/carb diet such as [employer].  Increase physical activity with aerobic activities 5 times a week and weightlifting every 2 days as fibromyalgia allows.  Other orders - levothyroxine  (SYNTHROID) 88 MCG tablet; Take 88 mcg by mouth daily before breakfast. - gabapentin (NEURONTIN) 100 MG capsule; Take 100 mg by mouth 2 (two) times daily. - celecoxib (CELEBREX) 100 MG capsule; Take 100 mg by mouth 2 (two) times daily.  Princess Bruins MD, 4:13 PM 11/02/2018

## 2018-11-03 ENCOUNTER — Encounter: Payer: Self-pay | Admitting: Obstetrics & Gynecology

## 2018-11-03 NOTE — Patient Instructions (Signed)
1. Well female exam with routine gynecological exam Normal gynecologic exam and menopause.  Last Pap test May 2019 was negative with negative high-risk HPV, no indication to repeat this year.  Breast exam normal.  Last screening mammogram in February 2020 was normal per patient, will obtain report from [facility].  Health labs with family physician.  Last colonoscopy 2016.  2. Postmenopause Well on no hormone replacement therapy.  No postmenopausal bleeding.  3. Class 2 obesity due to excess calories without serious comorbidity with body mass index (BMI) of 36.0 to 36.9 in adult Recommend a lower calorie/carb diet such as [employer].  Increase physical activity with aerobic activities 5 times a week and weightlifting every 2 days as fibromyalgia allows.  Other orders - levothyroxine (SYNTHROID) 88 MCG tablet; Take 88 mcg by mouth daily before breakfast. - gabapentin (NEURONTIN) 100 MG capsule; Take 100 mg by mouth 2 (two) times daily. - celecoxib (CELEBREX) 100 MG capsule; Take 100 mg by mouth 2 (two) times daily.  [name], it was a pleasure seeing you today!  I will inform you of your results as soon as they are available.

## 2018-11-08 ENCOUNTER — Ambulatory Visit: Payer: BLUE CROSS/BLUE SHIELD | Admitting: Cardiology

## 2018-11-08 ENCOUNTER — Encounter: Payer: Self-pay | Admitting: Cardiology

## 2018-11-08 ENCOUNTER — Other Ambulatory Visit: Payer: Self-pay

## 2018-11-08 VITALS — BP 130/87 | Ht 60.0 in | Wt 187.0 lb

## 2018-11-08 DIAGNOSIS — I10 Essential (primary) hypertension: Secondary | ICD-10-CM

## 2018-11-08 DIAGNOSIS — E668 Other obesity: Secondary | ICD-10-CM

## 2018-11-08 DIAGNOSIS — E7800 Pure hypercholesterolemia, unspecified: Secondary | ICD-10-CM | POA: Diagnosis not present

## 2018-11-08 NOTE — Progress Notes (Signed)
Virtual Visit via Video Note: This visit type was conducted due to national recommendations for restrictions regarding the COVID-19 Pandemic (e.g. social distancing).  This format is felt to be most appropriate for this patient at this time.  All issues noted in this document were discussed and addressed.  No physical exam was performed (except for noted visual exam findings with Telehealth visits).  The patient has consented to conduct a Telehealth visit and understands insurance will be billed.   I connected with@, on 11/08/18 at  by a video enabled telemedicine application and verified that I am speaking with the correct person using two identifiers.   I discussed the limitations of evaluation and management by telemedicine and the availability of in person appointments. The patient expressed understanding and agreed to proceed.   I have discussed with patient regarding the safety during COVID Pandemic and steps and precautions to be taken including social distancing, frequent hand wash and use of detergent soap, gels with the patient. I asked the patient to avoid touching mouth, nose, eyes, ears with the hands. I encouraged regular walking around the neighborhood and exercise and regular diet, as long as social distancing can be maintained.  Primary Physician/Referring:  Chesley Noon, MD  Patient ID: [name], female    DOB: 01-22-1966, 53 y.o.   MRN: [id number]  Chief Complaint  Patient presents with   Hypertension   Follow-up    HPI: [name]  is a 53 y.o. female   Caucasian female with hypertension, familial hyperlipidemia, history of pericarditis in August 2018 and fibromyalgia, hypertension and hyperlipidemia have been well controlled.  Family history is positive for early heart disease in her father in his late 55's.  She had an episode of peritonitis about a year ago.  Fortunately she has not had any recurrence of chest pain, dyspnea has been stable,  fibromyalgia continues to be a major issue.  She is tolerating her medications well.  States that her blood pressure is well controlled.  Past Medical History:  Diagnosis Date   DJD (degenerative joint disease)    knees and ankles   Hematuria    History of endometriosis    History of kidney stones    Hyperlipidemia    Hypertension    Migraines    Osteopenia 11/2017   T score -2.0 FRAX 6.3% / 0.8%   Pericarditis 04/27/2017   PONV (postoperative nausea and vomiting)    Right ureteral stone    Wears contact lenses     Past Surgical History:  Procedure Laterality Date   CYSTOSCOPY WITH RETROGRADE PYELOGRAM, URETEROSCOPY AND STENT PLACEMENT Right 10/04/2014   Procedure: [place], URETEROSCOPY ;  Surgeon: Rana Snare, MD;  Location: [facility];  Service: Urology;  Laterality: Right;   DILATION AND EVACUATION  1993   HOLMIUM LASER APPLICATION Right 07/23/863   Procedure: HOLMIUM LASER APPLICATION;  Surgeon: Rana Snare, MD;  Location: [facility];  Service: Urology;  Laterality: Right;   KIDNEY STONE SURGERY     LAPAROSCOPY LEFT OVARIAN CYSTECTOMY  06-17-2007   and Partial Left Salpingectomy   LAPAROSCOPY RIGHT TUBAL LIGATION/  REMOVAL IUD/  HYSTEROSCOPY GYNECARE ENDOMETRIAL BALLOON ABLATION  07-14-2003   RIGHT URETEROSCPIC LASER LITHOTRIPSY STONE EXTRACTION  02-15-2007    Social History   Socioeconomic History   Marital status: Married    Spouse name: Not on file   Number of children: 1   Years of education: Not on file  Highest education level: Not on file  Occupational History   Not on file  Social Needs   Financial resource strain: Not on file   Food insecurity:    Worry: Not on file    Inability: Not on file   Transportation needs:    Medical: Not on file    Non-medical: Not on file  Tobacco Use   Smoking status: Never Smoker   Smokeless tobacco: Never Used  Substance and  Sexual Activity   Alcohol use: No   Drug use: No   Sexual activity: Not Currently    Comment: declined insurance questions, des neg  Lifestyle   Physical activity:    Days per week: Not on file    Minutes per session: Not on file   Stress: Not on file  Relationships   Social connections:    Talks on phone: Not on file    Gets together: Not on file    Attends religious service: Not on file    Active member of club or organization: Not on file    Attends meetings of clubs or organizations: Not on file    Relationship status: Not on file   Intimate partner violence:    Fear of current or ex partner: Not on file    Emotionally abused: Not on file    Physically abused: Not on file    Forced sexual activity: Not on file  Other Topics Concern   Not on file  Social History Narrative   Not on file   Review of Systems  Constitution: Negative for chills, decreased appetite, malaise/fatigue and weight gain.  Cardiovascular: Positive for dyspnea on exertion (stable). Negative for leg swelling and syncope.  Endocrine: Negative for cold intolerance.  Hematologic/Lymphatic: Does not bruise/bleed easily.  Musculoskeletal: Positive for arthritis and myalgias. Negative for joint swelling.  Gastrointestinal: Negative for abdominal pain, anorexia, change in bowel habit, hematochezia and melena.  Neurological: Negative for headaches and light-headedness.  Psychiatric/Behavioral: Negative for depression and substance abuse.  All other systems reviewed and are negative.     Objective  Blood pressure 130/87, height 5' (1.524 m), weight 187 lb (84.8 kg), last menstrual period 04/09/2012. Body mass index is 36.52 kg/m.  Physical exam not performed or limited due to virtual visit.  Patient appeared to be in no distress, Neck was supple, respiration was not labored.  Please see exam details from prior visit is as below.    Physical Exam  Constitutional: She appears well-developed. No  distress.  Moderately obese  HENT:  Head: Atraumatic.  Eyes: Conjunctivae are normal.  Neck: Neck supple. No JVD present. No thyromegaly present.  Cardiovascular: Normal rate, regular rhythm, normal heart sounds and intact distal pulses. Exam reveals no gallop.  No murmur heard. Pulmonary/Chest: Effort normal and breath sounds normal.  Abdominal: Soft. Bowel sounds are normal.  Musculoskeletal: Normal range of motion.  Neurological: She is alert.  Skin: Skin is warm and dry.  Psychiatric: She has a normal mood and affect.   Radiology: No results found.  Laboratory examination:    Labs 05/28/2018: 0 glucose 80 mg, BUN 15, creatinine 0.69, EGFR greater than 60 mill, potassium 4.0.  CBC normal.  Total cholesterol 169, triglycerides 119, HDL 58, LDL 87.  TSH normal.  PRN Meds:. Medications Discontinued During This Encounter  Medication Reason   DULoxetine (CYMBALTA) 30 MG capsule Error   Current Meds  Medication Sig   amLODipine (NORVASC) 5 MG tablet Take 5 mg by mouth every evening.  celecoxib (CELEBREX) 100 MG capsule Take 100 mg by mouth 2 (two) times daily.   DULoxetine (CYMBALTA) 60 MG capsule Take 1 capsule by mouth daily.   gabapentin (NEURONTIN) 100 MG capsule Take 100 mg by mouth 2 (two) times daily.   levothyroxine (SYNTHROID) 88 MCG tablet Take 88 mcg by mouth daily before breakfast.   rosuvastatin (CRESTOR) 20 MG tablet Take 20 mg by mouth every evening.   zolmitriptan (ZOMIG) 5 MG tablet Take 5 mg by mouth as needed for migraine.    Cardiac Studies:  Echocardiogram 01/19/2018: Left ventricle cavity is normal in size. Mild concentric hypertrophy of the left ventricle. Normal global wall motion. Normal diastolic filling pattern. Calculated EF 60%. No significant valvular abnormality. No significant change from  Echo at [facility] 01/07/2017  Exercise nuclear stress test 01/08/2017:  There was no ST segment deviation noted during stress.  No T wave inversion was  noted during stress.  The study is normal.  This is a low risk study.  The left ventricular ejection fraction is normal (55-65%).  Assessment   Essential hypertension  Hypercholesteremia  Moderate obesity  EKG 01/11/2018: Normal sinus rhythm at 84 bpm, normal axis, normal interval, no evidence of ischemia. Normal EKG.   Recommendations:   Patient with multiple medical comorbidity especially fibromyalgia, moderate obesity who presents for follow-up of chest pain related to pericarditis that occurred almost a year ago.  Fortunately she has not had any recurrence of chest pain, overall she is presently doing well except for fibromyalgia.  I reviewed her labs, in view of her family history of  coronary artery disease (father with  MI in his late 39's.), she is on aggressive medical therapy, lipids are at goal and she is tolerating Crestor 20 mg daily.  Continue the same.  Blood pressure is also well controlled, although a week ago for the first time she had noticed her blood pressure to be slightly high.  Advised her that goal blood pressure is less than 130/80 mmHg.  Weight loss was discussed extensively.  She probably would be a good candidate for wellness clinic at [facility] for weight loss. I reviewed her labs.   I reassured her overall from cardiac standpoint, I will see her back on a as needed basis.  She is advised to contact me if she were to develop any chest pain or worsening dyspnea or any symptoms suggestive of cardiac issues.  Adrian Prows, MD, [facility] 11/08/2018, 4:28 PM [facility]. [place] Pager: [phone] Office: [phone] If no answer Cell [phone]

## 2019-01-26 DIAGNOSIS — G4733 Obstructive sleep apnea (adult) (pediatric): Secondary | ICD-10-CM

## 2019-01-26 HISTORY — DX: Obstructive sleep apnea (adult) (pediatric): G47.33

## 2019-03-02 ENCOUNTER — Encounter: Payer: Self-pay | Admitting: Gynecology

## 2019-07-25 ENCOUNTER — Encounter: Payer: Self-pay | Admitting: Obstetrics & Gynecology

## 2019-08-25 ENCOUNTER — Encounter: Payer: Self-pay | Admitting: Psychology

## 2019-08-25 ENCOUNTER — Ambulatory Visit: Payer: BC Managed Care – PPO | Admitting: Psychology

## 2019-08-25 ENCOUNTER — Other Ambulatory Visit: Payer: Self-pay

## 2019-08-25 DIAGNOSIS — F331 Major depressive disorder, recurrent, moderate: Secondary | ICD-10-CM | POA: Diagnosis not present

## 2019-08-25 DIAGNOSIS — R4189 Other symptoms and signs involving cognitive functions and awareness: Secondary | ICD-10-CM

## 2019-08-25 DIAGNOSIS — G4733 Obstructive sleep apnea (adult) (pediatric): Secondary | ICD-10-CM

## 2019-08-25 DIAGNOSIS — R413 Other amnesia: Secondary | ICD-10-CM

## 2019-08-25 DIAGNOSIS — M797 Fibromyalgia: Secondary | ICD-10-CM | POA: Diagnosis not present

## 2019-08-25 NOTE — Progress Notes (Signed)
   Psychometrician Note   Cognitive testing was administered to [name] by [name], B.S. (psychometrist) under the supervision of [name], Ph.D., licensed psychologist. [name] did not appear overtly distressed by the testing session per behavioral observation or responses across self-report questionnaires. [name], Ph.D. checked in with [name] as needed to manage any distress related to testing procedures (if applicable). Rest breaks were offered.    The battery of tests administered was selected by [name], Ph.D. with consideration to [name]'s current level of functioning, the nature of her symptoms, emotional and behavioral responses during interview, level of literacy, observed level of motivation/effort, and the nature of the referral question. This battery was communicated to the psychometrist. Communication between [name], Ph.D. and the psychometrist was ongoing throughout the evaluation and [name], Ph.D. was immediately accessible at all times. [name], Ph.D. provided supervision to the psychometrist on the date of this service to the extent necessary to assure the quality of all services provided.    [name] will return within approximately 1-2 weeks for an interactive feedback session with [name] at which time her test performances, clinical impressions, and treatment recommendations will be reviewed in detail. [name] understands she can contact our office should she require our assistance before this time.  A total of 135 minutes of billable time were spent face-to-face with [name] by the psychometrist. This includes both test administration and scoring time. Billing for these services is reflected in the clinical report generated by [name], Ph.D..  This note reflects time spent with the psychometrician and does not include test scores or any clinical  interpretations made by [name]. The full report will follow in a separate note.

## 2019-08-25 NOTE — Progress Notes (Signed)
NEUROPSYCHOLOGICAL EVALUATION [place] [place] [facility] Neu[facility]  Reason for Referral:   [name] is a 54 y.o. right-handed Caucasian female referred by Nani Skillern, PA, to characterize her current cognitive functioning and assist with diagnostic clarity and treatment planning in the context of subjective cognitive decline, obstructive sleep apnea, fibromyalgia, and major depressive disorder.  Assessment and Plan:   Clinical Impression(s): [name]'s pattern of performance is suggestive of neuropsychological functioning largely within normal limits. Relative weaknesses (largely below average normative range) were exhibited across encoding (i.e., learning) novel verbal and visual information efficiently, as well as across one unstructured task assessing pattern recognition and adaptability. Performance across processing speed, attention/concentration, executive functioning (outside of what is described above), receptive and expressive language, and retrieval/consolidation aspects of memory were normatively appropriate. [name] did not endorse difficulties completing instrumental activities of daily living (ADLs) independently.  Specific to memory, despite relative weaknesses learning information efficiently, performance across delayed recall tasks were appropriate and retention rates ranged from 86 to 100%. Performance on memory consolidation tasks was likewise appropriate, thus not suggestive of a memory storage deficit. Overall, memory scores are not consistent with early-onset Alzheimer's disease or other memory dysfunction.   [name] reported moderate symptoms of depression occurring within the past two weeks. This, as well as variable compliance with CPAP treatment and persisting and severe chronic pain and fatigue stemming from her history of fibromyalgia, can certainly negatively influence cognitive efficiency, especially surrounding  processing speed, complex attention, and new learning and memory. Put more plainly, chronic pain and depressive thoughts/emotions can be very distracting, leading to deficits attending to and processing information being presented to her. This is worsened by the overall feeling of being chronically fatigued, which also limits her ability to process new information. Overall, the most likely culprit for day-to-day cognitive dysfunction is believed to be a combination of these factors.  Recommendations: [name] reported minimal to no perceived positive effects from medications aimed at improving mood symptoms during interview. She is encouraged to speak with her PCP about medication adjustments to more directly influence ongoing depressive symptoms.  Consistent adherence (i.e., 100% adherence) to sleep apnea treatment (i.e., CPAP) will be vital in attempts to improve [name]'s functional and cognitive presentation. Frequent breath cessation in sleep apnea leads to an individual waking often throughout the night. While an individual may experience adequate sleep quantity, the quality of obtained sleep is often very poor as they are not able to spend enough time in deeper, more restorative levels of sleep. Variably treated sleep apnea certainly contributes to her experience of non-restful sleep, daytime fatigue, and perceived cognitive difficulties. Untreated sleep apnea will place her at an increased risk for stroke, heart attack, and future cognitive decline (i.e., dementia).    Stagnation in individual therapy can be common, especially after a period of positive gains. When/if this occurs, [name] would be encouraged to discuss this perception directly with her therapist. Ideally, this could lead to a reevaluation of current treatment goals and a review of what has been successful in improving symptoms in the past.   [name] reported minimal to no perceived personal support surrounding the  debilitating symptoms caused by her fibromyalgia. She may wish to consider engaging in a community fibromyalgia support group. There are two somewhat local groups located in [place], [place] ([email or web address]) which she could consider. Should would likely also benefit from chronic pain support groups, even if they do not address fibromyalgia specifically.  When learning new information, she would benefit from information being broken up into small, manageable pieces. She may also find it helpful to articulate the material in her own words and in a context to promote encoding at the onset of a new task. This material may need to be repeated multiple times to promote encoding.  Memory can be improved using internal strategies such as rehearsal, repetition, chunking, mnemonics, association, and imagery. External strategies such as written notes in a consistently used memory journal, visual and nonverbal auditory cues such as a calendar on the refrigerator or appointments with alarm, such as on a cell phone, can also help maximize recall.    To address problems with fluctuating attention/executive functioning, she may wish to consider:   -Avoiding external distractions when needing to concentrate   -Limiting exposure to fast paced environments with multiple sensory demands   -Writing down complicated information and using checklists   -Attempting and completing one task at a time (i.e., no multi-tasking)   -Verbalizing aloud each step of a task to maintain focus   -Reducing the amount of information considered at one time  Review of Records:   [name] was seen by [facility] [place], [place]) on 03/01/2019 for follow-up of sleep apnea and ongoing CPAP treatment. Her 30-day CPAP compliance data dated 01/30/2019 through 02/28/2019 showed 87% compliance for overall use and 83% compliance for >= 4 hour use. Her average use was 4 hours 31 minutes. Ms.  Levine was noted as being happy with her current mask, denied significant mask leaks, and has been cleaning this equipment as instructed.  [name] was more recently seen by [place] [facility]ast[facility] Pall, M.D.) on 07/05/2019 for follow-up of several medical and psychiatric conditions. Hypertension and hyperlipidemia were said to be managed well via medications. Symptoms of depression were said to be largely unchanged. Regarding fibromyalgia, she reported significant pain and extreme fatigue, decreased range of motion, and decreased flexibility. These symptoms are persistent despite regular medication use. Concerns for memory changes were also reported. Ultimately, Ms. Curfman was referred for a comprehensive neuropsychological evaluation to characterize her cognitive abilities and to assist with diagnostic clarity and treatment planning.   Of note, records suggest that Ms. Mindel previously completed a comprehensive neuropsychological evaluation. During interview, she noted that this was through her insurance company in the context of difficulties performing work-related responsibilities due to severe chronic pain and fatigue. These records were unavailable for review and Ms. Okray expressed some concerns surrounding the findings and interpretation of that report.   Brain MRI on 10/23/2009 was unremarkable.    Past Medical History:  Diagnosis Date  . DJD (degenerative joint disease)    knees and ankles  . Essential hypertension 07/12/2014  . Familial hypercholesterolemia 07/16/2015  . Fibromyalgia 06/23/2017  . Hematuria   . History of endometriosis   . History of kidney stones   . Hyperlipidemia   . MDD (major depressive disorder) 10/21/2018  . Migraine with aura and without status migrainosus, not intractable 07/16/2015  . Osteopenia 11/2017   T score -2.0 FRAX 6.3% / 0.8%  . Pericarditis 04/27/2017  . PONV (postoperative nausea and vomiting)   . Sleep apnea, obstructive  01/26/2019  . Wears contact lenses     Past Surgical History:  Procedure Laterality Date  . CYSTOSCOPY WITH RETROGRADE PYELOGRAM, URETEROSCOPY AND STENT PLACEMENT Right 10/04/2014   Procedure: CYSTOSCOPY WITH RETROGRADE PYELOGRAM, URETEROSCOPY ;  Surgeon: Rana Snare, MD;  Location: [facility];  Service: Urology;  Laterality: Right;  . DILATION AND EVACUATION  1993  . HOLMIUM LASER APPLICATION Right [id number]   Procedure: HOLMIUM LASER APPLICATION;  Surgeon: Rana Snare, MD;  Location: [facility];  Service: Urology;  Laterality: Right;  . KIDNEY STONE SURGERY    . LAPAROSCOPY LEFT OVARIAN CYSTECTOMY  06-17-2007   and Partial Left Salpingectomy  . LAPAROSCOPY RIGHT TUBAL LIGATION/  REMOVAL IUD/  HYSTEROSCOPY GYNECARE ENDOMETRIAL BALLOON ABLATION  07-14-2003  . RIGHT URETEROSCPIC LASER LITHOTRIPSY STONE EXTRACTION  02-15-2007    Current Outpatient Medications:  .  amLODipine (NORVASC) 5 MG tablet, Take 5 mg by mouth every evening. , Disp: , Rfl:  .  celecoxib (CELEBREX) 100 MG capsule, Take 100 mg by mouth 2 (two) times daily., Disp: , Rfl:  .  DULoxetine (CYMBALTA) 60 MG capsule, Take 1 capsule by mouth daily., Disp: , Rfl:  .  gabapentin (NEURONTIN) 100 MG capsule, Take 100 mg by mouth 2 (two) times daily., Disp: , Rfl:  .  levothyroxine (SYNTHROID) 88 MCG tablet, Take 88 mcg by mouth daily before breakfast., Disp: , Rfl:  .  rosuvastatin (CRESTOR) 20 MG tablet, Take 20 mg by mouth every evening., Disp: , Rfl: 3 .  zolmitriptan (ZOMIG) 5 MG tablet, Take 5 mg by mouth as needed for migraine., Disp: , Rfl:   Clinical Interview:   Cognitive Symptoms: Decreased short-term memory: Endorsed. Ms. Bowerman reported symptoms of generalized forgetfulness, noting concern that short-term memory difficulties may be more advanced relative to age-expected changes. Upon further questioning, she described losing her train of thought, some instances of absentmindedness  (e.g., going in the opposite direction before realizing her mistake), and occasionally having trouble recalling details of previous conversations. Memory deficits were said to be more noticeable for the past 1.5 years and have gradually worsened over that time.  Decreased long-term memory: Denied. Decreased attention/concentration: Endorsed. She reported trouble maintaining her focus, as well as increased distractibility.  Reduced processing speed: Endorsed. She noted slowed processing speed, as well as an experience of "brain fog" attributed to her fibromyalgia.  Difficulties with executive functions: Endorsed. She noted trouble with organization. When asked about impulsivity, she reported being more easily agitated or irritated and occasionally have mild outbursts (which are out of character for her). Other more pronounced personality changes were denied.  Difficulties with emotion regulation: Denied outside what is described above. Difficulties with receptive language: Denied. Difficulties with word finding: Endorsed. She reported her words frequently getting jumbled, as well as having instances where she has trouble articulating herself. She also alluded to occasional instances where she seems to switch the beginning letters of words in her mind.  Decreased visuoperceptual ability: Denied.  Difficulties completing ADLs: Largely denied. She acknowledged infrequent instances where she has forgotten to pay a bill or take her medications. Driving difficulties were denied.   Additional Medical History: History of traumatic brain injury/concussion: Denied. History of stroke: Denied. History of seizure activity: Denied. History of known exposure to toxins: Denied. Symptoms of chronic pain: Endorsed. As described above, she has a history of fibromyalgia which results in diffuse and debilitating physical pain throughout her body, as well as symptoms of chronic fatigue. These symptoms are present daily  and persist despite medication interventions. She noted that her and her PCP have been discussing a referral to additional pain specialists in an attempt to improve the management of these symptoms.  Experience of frequent headaches/migraines: Endorsed. She reported a history of migraine headaches, but noted that  these symptoms are "pretty much gone now." However, within the past 6-8 weeks, she described experiencing a "very sharp pain that will take [her] breath" occurring somewhat frequently. She was unclear surrounding any antecedents of this experience.  Frequent instances of dizziness/vertigo: Denied.  Sensory changes: She wears glasses/contacts with positive effect. Other sensory changes/difficulties (e.g., hearing, taste, or smell) were denied.  Balance/coordination difficulties: She noted a period of time this past Fall where she experienced some balance instability and had several falls in a relatively short period of time. She was unclear why this occurred and largely denied acute balance concerns.  Other motor difficulties: Denied.  Sleep History: Estimated hours obtained each night: Unclear. Ms. Barasch noted that her sleep can vary from largely being unable to sleep at all during the night to sleeping 12 hours and it still not feeling like it is enough.  Difficulties falling asleep: Denied. Difficulties staying asleep: Endorsed. She reported commonly waking up every hour due to a combination of pain and OSA-related symptoms.  Feels rested and refreshed upon awakening: Denied.  History of snoring: Endorsed. History of waking up gasping for air: Endorsed. Witnessed breath cessation while asleep: Endorsed. She has a history of obstructive sleep apnea. She acknowledged varying adherence to her CPAP machine treatment lately, stating that she will sometimes go days without using this device. This was largely attributed to symptoms of claustrophobia when wearing the mask. She reported feeling  that she sleeps "fine" when not using the device. However, she also noted trouble staying asleep and not feeling rested upon awakening.   History of vivid dreaming: Endorsed. These were said to be a more recent occurrence and she was unclear if they were linked to any newly prescribed medication.  Excessive movement while asleep: Denied. Instances of acting out her dreams: Denied.  Psychiatric/Behavioral Health History: Depression: Endorsed. She reported a history of major depressive disorder. While these symptoms were likely present prior to her becoming unemployed in February 2020, they have been exacerbated since that time. Primary sources of mood-related distress were said to come from chronic familial and financial stressors, pain and fatigue symptoms due to fibromyalgia, and a lack of support from her husband (she reported him saying that there is "nothing wrong" with her and that she is "just lazy"). Ms. Raines denied taking a specific mood-related medication, stating that one of her fibromyalgia medications was said to also address symptoms of depression. However, she denied being able to notice a change or improvement in mood symptoms. She does work with an Haematologist weekly, which has been beneficial.  Anxiety: Denied. Mania: Denied. Trauma History: Denied. Visual/auditory hallucinations: Denied. Delusional thoughts: Denied.  Tobacco: Denied. Alcohol: She denied current alcohol consumption, as well as a history of problematic alcohol abuse or dependence. Recreational drugs: Denied. Caffeine: 3-4 cups of caffeinated soda/sweet tea daily.   Family History: Problem Relation Age of Onset  . CAD Father        stents in his 48s  . Parkinson's disease Father        Parkinson's disease dementia  . Hyperlipidemia Mother   . Alzheimer's disease Mother        Symptom onset in late 56s   This information was confirmed by Ms. Travieso.  Academic/Vocational History: Highest  level of educational attainment: 18 years. Ms. Eargle earned a Master's degree in public administration. She described herself as a strong (A/B) student in academic settings. Math was noted as a potential relative weakness.  History of developmental delay:  Denied. History of grade repetition: Denied. Enrollment in special education courses: Denied. History of LD/ADHD: Denied.  Employment: Unemployed due to her history of debilitating chronic pain and extreme fatigue stemming from fibromyalgia. Prior to this, she worked in a variety of HR capacities for many years.   Evaluation Results:   Behavioral Observations: Ms. Marke was unaccompanied, arrived to her appointment on time, and was appropriately dressed and groomed. Observed gait and station were within normal limits. Gross motor functioning appeared intact upon informal observation and no abnormal movements (e.g., tremors) were noted. Her affect was generally relaxed and positive, but did range appropriately given the subject being discussed during the clinical interview or the task at hand during testing procedures. This was especially apparent when she became tearful discussing the passing of her parents and the lack of support she feels she receives at home. Spontaneous speech was fluent and word finding difficulties were not observed during the clinical interview. Thought processes were coherent, organized, and normal in content. Insight into her cognitive difficulties appeared appropriate, but likely misattributed to abnormal cognitive decline rather than being caused by her various medical and psychiatric conditions. During testing, sustained attention was appropriate. Task engagement was adequate and she persisted when challenged. Overall, Ms. Woodfin was cooperative with the clinical interview and subsequent testing procedures.   Adequacy of Effort: The validity of neuropsychological testing is limited by the extent to which the  individual being tested may be assumed to have exerted adequate effort during testing. Ms. Orama expressed her intention to perform to the best of her abilities and exhibited adequate task engagement and persistence. Scores across stand-alone and embedded performance validity measures were within expectation. As such, the results of the current evaluation are believed to be a valid representation of Ms. Schwark's current cognitive functioning.  Test Results: Ms. Maler was fully oriented at the time of the current evaluation.  Intellectual abilities based upon educational and vocational attainment were estimated to be in the average range. Premorbid abilities were estimated to be within the average range based upon a single-word reading test.   Processing speed was mildly variable, ranging from the below average to above average normative ranges. Basic attention was above average. More complex attention (e.g., working memory) was average. Executive functioning was below average to above average. A relative weakness was exhibited across an unstructured task assessing pattern recognition and adaptability.  Assessed receptive language abilities were average. Likewise, Ms. Sisneros did not exhibit any difficulties comprehending task instructions and answered all questions asked of her appropriately. Assessed expressive language (e.g., verbal fluency and confrontation naming) was average.     Assessed visuospatial/visuoconstructional abilities were below average to above average.    Learning (i.e., encoding) of novel verbal and visual information was well below average to average. Spontaneous delayed recall (i.e., retrieval) of previously learned information was below average to average. Retention rates were 86% across a story learning task, 100% across a list learning task, and 87.5% across a shape learning task. Performance across recognition tasks was appropriate, suggesting evidence for information  consolidation.   Results of emotional screening instruments suggested that recent symptoms of generalized anxiety were in the minimal range, while symptoms of depression were within the moderate range. A screening instrument assessing recent sleep quality suggested the presence of mild sleep dysfunction.  Tables of Scores:   Note: This summary of test scores accompanies the interpretive report and should not be considered in isolation without reference to the appropriate sections in the text. Descriptors  are based on appropriate normative data and may be adjusted based on clinical judgment. The terms "impaired" and "within normal limits (WNL)" are used when a more specific level of functioning cannot be determined.       Effort Testing:   DESCRIPTOR       ACS Word Choice: --- --- Within Expectation  HVLT-R Recognition Discrimination Index: --- --- Within Expectation  BVMT-R Retention Percentage: --- --- Within Expectation       Orientation:      Raw Score Percentile   NAB Orientation, Form 1 29/29 --- ---       Intellectual Functioning:           Standard Score Percentile   Test of Premorbid Functioning: 108 70 Average       Memory:          Wechsler Memory Scale (WMS-IV):                       Raw Score (Scaled Score) Percentile     Logical Memory I 22/50 (9) 37 Average    Logical Memory II 19/50 (9) 37 Average    Logical Memory Recognition 24/30 26-50 Average       Hopkins Verbal Learning Test (HVLT-R), Form 1: Raw Score (T Score) Percentile     Total Trials 1-3 24/36 (41) 18 Below Average    Delayed Recall 10/12 (50) 50 Average    Recognition Discrimination Index 12 (58) 79 Above Average      True Positives 12 --- ---      False Positives 0 --- ---       Brief Visuospatial Memory Test (BVMT-R), Form 1: Raw Score (T Score) Percentile     Total Trials 1-3 16/36 (36) 8 Well Below Average    Delayed Recall 7/12 (41) 18 Below Average    Recognition Discrimination Index 5 6-10  Well Below Average      Recognition Hits 5/6 11-16 Below Average      False Positive Errors 0 >16 Within Normal Limits        Attention/Executive Function:          Trail Making Test (TMT): Raw Score (T Score) Percentile     Part A 22 secs.,  0 errors (52) 58 Average    Part B 54 secs.,  0 errors (51) 54 Average         Scaled Score Percentile   WAIS-IV Coding: 9 37 Average       NAB Attention Module, Form 1: T Score Percentile     Digits Forward 57 75 Above Average    Digits Backwards 49 46 Average       D-KEFS Color-Word Interference Test: Raw Score (Scaled Score) Percentile     Color Naming 38 secs. (7) 16 Below Average    Word Reading 16 secs. (14) 91 Above Average    Inhibition 51 secs. (12) 75 Above Average      Total Errors 1 error (11) 63 Average    Inhibition/Switching 53 secs. (13) 84 Above Average      Total Errors 0 errors (12) 75 Above Average       D-KEFS Verbal Fluency Test: Raw Score (Scaled Score) Percentile     Letter Total Correct 33 (9) 37 Average    Category Total Correct 32 (8) 25 Average    Category Switching Total Correct 11 (8) 25 Average    Category Switching Accuracy 10 (8) 25 Average  Total Set Loss Errors 0 (13) 84 Above Average      Total Repetition Errors 2 (11) 63 Average       D-KEFS 20 Questions Test: Scaled Score Percentile     Total Weighted Achievement Score 12 75 Above Average    Initial Abstraction Score 8 25 Average       Wisconsin Card Sorting Test [facility]): Raw Score Percentile     Categories (trials) 2 (64) 11-16 Below Average    Total Errors 20 16 Below Average    Perseverative Errors 8 16 Below Average    Non-Perseverative Errors 12 7 Well Below Average    Failure to Maintain Set 1 --- ---       Language:          NAB Language Module, Form 1: T Score Percentile     Auditory Comprehension 55 69 Average    Naming 31/31 (53) 62 Average       Visuospatial/Visuoconstruction:      Raw Score Percentile   Clock Drawing:  10/10 --- Within Normal Limits       NAB Spatial Module, Form 1: T Score Percentile     Figure Drawing Copy 58 79 Above Average    Figure Drawing Immediate Recall 64 92 Well Above Average        Scaled Score Percentile   WAIS-IV Block Design: 7 16 Below Average  WAIS-IV Matrix Reasoning: 9 37 Average       Mood and Personality:      Raw Score Percentile   Beck Depression Inventory - II: 22 --- Moderate  PROMIS Anxiety Questionnaire: 15 --- None to Slight       Additional Questionnaires:      Raw Score Percentile   PROMIS Sleep Disturbance Questionnaire: 28 --- Mild   Informed Consent and Coding/Compliance:   Ms. Kaminski was provided with a verbal description of the nature and purpose of the present neuropsychological evaluation. Also reviewed were the foreseeable risks and/or discomforts and benefits of the procedure, limits of confidentiality, and mandatory reporting requirements of this provider. The patient was given the opportunity to ask questions and receive answers about the evaluation. Oral consent to participate was provided by the patient.   This evaluation was conducted by Christia Reading, Ph.D., licensed clinical neuropsychologist. Ms. Atz completed a comprehensive clinical interview with Dr. Melvyn Novas, billed as one unit [phone], and 135 minutes of cognitive testing and scoring, billed as one unit [phone] and four additional units 96139. Psychometrist Milana Kidney, B.S., assisted Dr. Melvyn Novas with test administration and scoring procedures. As a separate and discrete service, Dr. Melvyn Novas spent a total of 180 minutes in interpretation and report writing billed as one unit [phone] and two units 96133.

## 2019-09-01 ENCOUNTER — Other Ambulatory Visit: Payer: Self-pay

## 2019-09-01 ENCOUNTER — Ambulatory Visit (INDEPENDENT_AMBULATORY_CARE_PROVIDER_SITE_OTHER): Payer: BC Managed Care – PPO | Admitting: Psychology

## 2019-09-01 DIAGNOSIS — F331 Major depressive disorder, recurrent, moderate: Secondary | ICD-10-CM | POA: Diagnosis not present

## 2019-09-01 DIAGNOSIS — M797 Fibromyalgia: Secondary | ICD-10-CM | POA: Diagnosis not present

## 2019-09-01 DIAGNOSIS — G4733 Obstructive sleep apnea (adult) (pediatric): Secondary | ICD-10-CM | POA: Diagnosis not present

## 2019-09-01 NOTE — Progress Notes (Signed)
   Neuropsychology Feedback Session [name]. [place] Department of Neurology  Reason for Referral:   [name] [facility] a 54 y.o. right-handed Caucasian female referred by Nani Skillern, PA,to characterize hercurrent cognitive functioning and assist with diagnostic clarity and treatment planning in the context of subjective cognitive decline, obstructive sleep apnea, fibromyalgia, and major depressive disorder.  Feedback:   [name] completed a comprehensive neuropsychological evaluation on 08/25/2019. Please refer to that encounter for the full report and recommendations. Briefly, results suggested neuropsychological functioning largely within normal limits. Relative weaknesses (largely below average normative range) were exhibited across encoding (i.e., learning) novel verbal and visual information efficiently, as well as across one unstructured task assessing pattern recognition and adaptability. [name] reported moderate symptoms of depression occurring within the past two weeks. This, as well as variable compliance with CPAP treatment and persisting and severe chronic pain and fatigue stemming from her history of fibromyalgia, can certainly negatively influence cognitive efficiency, especially surrounding processing speed, complex attention, and new learning and memory. Overall, the most likely culprit for day-to-day cognitive dysfunction is believed to be a combination of these factors.  [name] was unaccompanied. Content of the current session focused on the results of her neuropsychological evaluation. [name] was given the opportunity to ask questions and her questions were answered. She was encouraged to reach out should additional questions arise. A copy of her report was provided at the conclusion of the visit.      16 minutes were spent conducting the current feedback session with [name], billed as one unit [phone].

## 2019-09-22 ENCOUNTER — Ambulatory Visit: Payer: BC Managed Care – PPO | Attending: Internal Medicine

## 2019-09-22 DIAGNOSIS — Z23 Encounter for immunization: Secondary | ICD-10-CM

## 2019-09-22 NOTE — Progress Notes (Signed)
   Covid-19 Vaccination Clinic  Name:  [name]    MRN: [id number] DOB: 02/01/1966  09/22/2019  [name] was observed post Covid-19 immunization for 15 minutes without incident. She was provided with Vaccine Information Sheet and instruction to access the V-Safe system.   [name] was instructed to call 911 with any severe reactions post vaccine: Marland Kitchen Difficulty breathing  . Swelling of face and throat  . A fast heartbeat  . A bad rash all over body  . Dizziness and weakness   Immunizations Administered    Name Date Dose VIS Date Route   Moderna COVID-19 Vaccine 09/22/2019 10:00 AM 0.5 mL 05/10/2019 Intramuscular   Manufacturer: Moderna   Lot: [id number]   [id number]

## 2019-09-26 ENCOUNTER — Telehealth: Payer: Self-pay

## 2019-09-26 NOTE — Telephone Encounter (Signed)
[facility] Phone [phone] fax [phone]. Hassan Rowan with Nani Skillern PA.  Requesting testing and results from [facility] faxed.

## 2019-10-25 ENCOUNTER — Ambulatory Visit: Payer: BC Managed Care – PPO | Attending: Internal Medicine

## 2019-10-25 ENCOUNTER — Ambulatory Visit: Payer: BC Managed Care – PPO

## 2019-10-25 DIAGNOSIS — Z23 Encounter for immunization: Secondary | ICD-10-CM

## 2019-10-25 NOTE — Progress Notes (Signed)
   Covid-19 Vaccination Clinic  Name:  [name]    MRN: [id number] DOB: [birth year]  10/25/2019  [name] was observed post Covid-19 immunization for 15 minutes without incident. She was provided with Vaccine Information Sheet and instruction to access the V-Safe system.   [name] was instructed to call 911 with any severe reactions post vaccine: Marland Kitchen Difficulty breathing  . Swelling of face and throat  . A fast heartbeat  . A bad rash all over body  . Dizziness and weakness   Immunizations Administered    Name Date Dose VIS Date Route   Moderna COVID-19 Vaccine 10/25/2019 12:00 PM 0.5 mL 05/2019 Intramuscular   Manufacturer: Levan Hurst   Lot: [id number]   Buck [id number]

## 2019-10-26 ENCOUNTER — Ambulatory Visit: Payer: BC Managed Care – PPO

## 2019-11-03 ENCOUNTER — Encounter: Payer: Self-pay | Admitting: Obstetrics & Gynecology

## 2019-11-03 ENCOUNTER — Other Ambulatory Visit: Payer: Self-pay

## 2019-11-03 ENCOUNTER — Ambulatory Visit: Payer: BC Managed Care – PPO | Admitting: Obstetrics & Gynecology

## 2019-11-03 VITALS — BP 128/82 | Ht 60.25 in | Wt 193.0 lb

## 2019-11-03 DIAGNOSIS — Z6837 Body mass index (BMI) 37.0-37.9, adult: Secondary | ICD-10-CM

## 2019-11-03 DIAGNOSIS — M8580 Other specified disorders of bone density and structure, unspecified site: Secondary | ICD-10-CM

## 2019-11-03 DIAGNOSIS — Z780 Asymptomatic menopausal state: Secondary | ICD-10-CM

## 2019-11-03 DIAGNOSIS — Z01419 Encounter for gynecological examination (general) (routine) without abnormal findings: Secondary | ICD-10-CM | POA: Diagnosis not present

## 2019-11-03 DIAGNOSIS — E6609 Other obesity due to excess calories: Secondary | ICD-10-CM | POA: Diagnosis not present

## 2019-11-03 NOTE — Patient Instructions (Signed)
1. Well female exam with routine gynecological exam Normal gynecologic exam in menopause.  Pap test in May 2019 was negative, will repeat at 3 years.  Breast exam normal.  Screening mammogram February 2021 was negative.  Colonoscopy in 2016.  Health labs with family physician.  2. Postmenopause Well on no hormone replacement therapy.  No postmenopausal bleeding.  3. Osteopenia, unspecified location Osteopenia on bone density June 2019 with lowest T score at -2.0.  We will repeat a bone density in June 2021.  Continue with vitamin D, calcium intake of 1200 mg daily and regular weightbearing physical activities. - DG Bone Density; Future  4. Class 2 obesity due to excess calories without serious comorbidity with body mass index (BMI) of 37.0 to 37.9 in adult Recommend a lower calorie/carb diet such as [employer].  Intermittent fasting reviewed and recommended.  Recommend aerobic physical activities 5 times a week and light weightlifting every 2 days.  [name], it was a pleasure seeing you today!

## 2019-11-03 NOTE — Progress Notes (Signed)
[name] Jun 01, 1966 [id number]   History:    54 y.o.  G1P1L1 Married.  Son Scientist, physiological in [place].    RP:  Established patient presenting for annual gyn exam   HPI: Menopause, well on no HRT.  No PMB.  No pelvic pain.  No pain with IC.  Fibromyalgia worse x 07/2018, not working.  Urine/BMs normal.  Breasts normal.  Health labs with Fam MD.   Past medical history,surgical history, family history and social history were all reviewed and documented in the EPIC chart.  Gynecologic History Patient's last menstrual period was 04/09/2012.  Obstetric History OB History  Gravida Para Term Preterm AB Living  1       0 1  SAB TAB Ectopic Multiple Live Births      0        # Outcome Date GA Lbr Len/2nd Weight Sex Delivery Anes PTL Lv  1 Gravida              ROS: A ROS was performed and pertinent positives and negatives are included in the history.  GENERAL: No fevers or chills. HEENT: No change in vision, no earache, sore throat or sinus congestion. NECK: No pain or stiffness. CARDIOVASCULAR: No chest pain or pressure. No palpitations. PULMONARY: No shortness of breath, cough or wheeze. GASTROINTESTINAL: No abdominal pain, nausea, vomiting or diarrhea, melena or bright red blood per rectum. GENITOURINARY: No urinary frequency, urgency, hesitancy or dysuria. MUSCULOSKELETAL: No joint or muscle pain, no back pain, no recent trauma. DERMATOLOGIC: No rash, no itching, no lesions. ENDOCRINE: No polyuria, polydipsia, no heat or cold intolerance. No recent change in weight. HEMATOLOGICAL: No anemia or easy bruising or bleeding. NEUROLOGIC: No headache, seizures, numbness, tingling or weakness. PSYCHIATRIC: No depression, no loss of interest in normal activity or change in sleep pattern.     Exam:   BP 128/82   Ht 5' 0.25" (1.53 m)   Wt 193 lb (87.5 kg)   LMP 04/09/2012   BMI 37.38 kg/m   Body mass index is 37.38 kg/m.  General appearance : Well developed well nourished  female. No acute distress HEENT: Eyes: no retinal hemorrhage or exudates,  Neck supple, trachea midline, no carotid bruits, no thyroidmegaly Lungs: Clear to auscultation, no rhonchi or wheezes, or rib retractions  Heart: Regular rate and rhythm, no murmurs or gallops Breast:Examined in sitting and supine position were symmetrical in appearance, no palpable masses or tenderness,  no skin retraction, no nipple inversion, no nipple discharge, no skin discoloration, no axillary or supraclavicular lymphadenopathy Abdomen: no palpable masses or tenderness, no rebound or guarding Extremities: no edema or skin discoloration or tenderness  Pelvic: Vulva: Normal             Vagina: No gross lesions or discharge  Cervix: No gross lesions or discharge  Uterus  AV, normal size, shape and consistency, non-tender and mobile  Adnexa  Without masses or tenderness  Anus: Normal   Assessment/Plan:  54 y.o. female for annual exam   1. Well female exam with routine gynecological exam Normal gynecologic exam in menopause.  Pap test in May 2019 was negative, will repeat at 3 years.  Breast exam normal.  Screening mammogram February 2021 was negative.  Colonoscopy in 2016.  Health labs with family physician.  2. Postmenopause Well on no hormone replacement therapy.  No postmenopausal bleeding.  3. Osteopenia, unspecified location Osteopenia on bone density June 2019 with lowest T score at -2.0.  We will  repeat a bone density in June 2021.  Continue with vitamin D, calcium intake of 1200 mg daily and regular weightbearing physical activities. - DG Bone Density; Future  4. Class 2 obesity due to excess calories without serious comorbidity with body mass index (BMI) of 37.0 to 37.9 in adult Recommend a lower calorie/carb diet such as [employer].  Intermittent fasting reviewed and recommended.  Recommend aerobic physical activities 5 times a week and light weightlifting every 2 days.  Princess Bruins MD,  9:08 AM 11/03/2019

## 2019-11-22 ENCOUNTER — Ambulatory Visit (INDEPENDENT_AMBULATORY_CARE_PROVIDER_SITE_OTHER): Payer: BC Managed Care – PPO

## 2019-11-22 ENCOUNTER — Other Ambulatory Visit: Payer: Self-pay

## 2019-11-22 ENCOUNTER — Other Ambulatory Visit: Payer: Self-pay | Admitting: Obstetrics & Gynecology

## 2019-11-22 DIAGNOSIS — Z780 Asymptomatic menopausal state: Secondary | ICD-10-CM

## 2019-11-22 DIAGNOSIS — M8589 Other specified disorders of bone density and structure, multiple sites: Secondary | ICD-10-CM | POA: Diagnosis not present

## 2019-11-22 DIAGNOSIS — M8580 Other specified disorders of bone density and structure, unspecified site: Secondary | ICD-10-CM

## 2020-07-25 ENCOUNTER — Encounter: Payer: Self-pay | Admitting: Obstetrics & Gynecology

## 2020-11-06 ENCOUNTER — Other Ambulatory Visit (HOSPITAL_COMMUNITY)
Admission: RE | Admit: 2020-11-06 | Discharge: 2020-11-06 | Disposition: A | Discharge status: Home / Self Care | Payer: BC Managed Care – PPO | Source: Ambulatory Visit | Attending: Obstetrics & Gynecology | Admitting: Obstetrics & Gynecology

## 2020-11-06 ENCOUNTER — Encounter: Payer: Self-pay | Admitting: Obstetrics & Gynecology

## 2020-11-06 ENCOUNTER — Other Ambulatory Visit: Payer: Self-pay

## 2020-11-06 ENCOUNTER — Ambulatory Visit (INDEPENDENT_AMBULATORY_CARE_PROVIDER_SITE_OTHER): Payer: BC Managed Care – PPO | Admitting: Obstetrics & Gynecology

## 2020-11-06 VITALS — Ht 60.0 in | Wt 197.0 lb

## 2020-11-06 DIAGNOSIS — N393 Stress incontinence (female) (male): Secondary | ICD-10-CM

## 2020-11-06 DIAGNOSIS — Z780 Asymptomatic menopausal state: Secondary | ICD-10-CM

## 2020-11-06 DIAGNOSIS — Z6838 Body mass index (BMI) 38.0-38.9, adult: Secondary | ICD-10-CM

## 2020-11-06 DIAGNOSIS — Z01419 Encounter for gynecological examination (general) (routine) without abnormal findings: Secondary | ICD-10-CM | POA: Insufficient documentation

## 2020-11-06 DIAGNOSIS — M8580 Other specified disorders of bone density and structure, unspecified site: Secondary | ICD-10-CM

## 2020-11-06 NOTE — Progress Notes (Signed)
[place] [birth year] [id number]   History:    55 y.o. G1P1L1 Married. Son finished Sophomore year in [place].   VV:[id number] presenting for annual gyn exam   EVO:[id number], well on no HRT. No PMB. No pelvic pain. Not sexually active recently. Fibromyalgia worse x 07/2018, not working. Urine/BMs normal. Mild SUI.  Breasts normal. BMI 38.47.Health labs with Fam MD.   Past medical history,surgical history, family history and social history were all reviewed and documented in the EPIC chart.  Gynecologic History Patient's last menstrual period was 04/09/2012.  Obstetric History OB History  Gravida Para Term Preterm AB Living  1       0 1  SAB IAB Ectopic Multiple Live Births      0        # Outcome Date GA Lbr Len/2nd Weight Sex Delivery Anes PTL Lv  1 Gravida              ROS: A ROS was performed and pertinent positives and negatives are included in the history.  GENERAL: No fevers or chills. HEENT: No change in vision, no earache, sore throat or sinus congestion. NECK: No pain or stiffness. CARDIOVASCULAR: No chest pain or pressure. No palpitations. PULMONARY: No shortness of breath, cough or wheeze. GASTROINTESTINAL: No abdominal pain, nausea, vomiting or diarrhea, melena or bright red blood per rectum. GENITOURINARY: No urinary frequency, urgency, hesitancy or dysuria. MUSCULOSKELETAL: No joint or muscle pain, no back pain, no recent trauma. DERMATOLOGIC: No rash, no itching, no lesions. ENDOCRINE: No polyuria, polydipsia, no heat or cold intolerance. No recent change in weight. HEMATOLOGICAL: No anemia or easy bruising or bleeding. NEUROLOGIC: No headache, seizures, numbness, tingling or weakness. PSYCHIATRIC: No depression, no loss of interest in normal activity or change in sleep pattern.     Exam:   Ht 5' (1.524 m)   Wt 197 lb (89.4 kg)   LMP 04/09/2012   BMI 38.47 kg/m   Body mass index is 38.47 kg/m.  General appearance : Well  developed well nourished female. No acute distress HEENT: Eyes: no retinal hemorrhage or exudates,  Neck supple, trachea midline, no carotid bruits, no thyroidmegaly Lungs: Clear to auscultation, no rhonchi or wheezes, or rib retractions  Heart: Regular rate and rhythm, no murmurs or gallops Breast:Examined in sitting and supine position were symmetrical in appearance, no palpable masses or tenderness,  no skin retraction, no nipple inversion, no nipple discharge, no skin discoloration, no axillary or supraclavicular lymphadenopathy Abdomen: no palpable masses or tenderness, no rebound or guarding Extremities: no edema or skin discoloration or tenderness  Pelvic: Vulva: Normal             Vagina: No gross lesions or discharge  Cervix: No gross lesions or discharge.  Pap reflex done.  Uterus  AV, normal size, shape and consistency, non-tender and mobile  Adnexa  Without masses or tenderness  Anus: Normal   Assessment/Plan:  55 y.o. female for annual exam   1. Encounter for routine gynecological examination with Papanicolaou smear of cervix Normal gynecologic exam.  Pap reflex done.  Breast exam normal.  Screening mammogram negative February 2022.  Colonoscopy 6 years ago.  Health labs with family physician. - Cytology - PAP( [facility])  2. Postmenopause Postmenopausal, well on no hormone replacement therapy.  No postmenopausal bleeding.  3. Osteopenia, unspecified location Osteopenia on bone density June 2021.  T score was improved.  Continue with vitamin D supplements and calcium intake of 1.5 g/day total.  Encouraged  to do weightbearing physical activities regularly.  4. SUI (stress urinary incontinence, female) Counseling on stress urinary incontinence done.  Kegel exercises instructed.  Will refer to physical therapy for pelvic floor reinforcement as needed.  Recommend avoiding overfilling the bladder.   5. Class 2 severe obesity due to excess calories with serious comorbidity and  body mass index (BMI) of 38.0 to 38.9 in adult [facility]) Recommend a lower calorie/carb diet.  Aerobic activities 5 times a week and light weightlifting every 2 days.   Other orders - buPROPion (WELLBUTRIN XL) 150 MG 24 hr tablet; Take 150 mg by mouth daily. - amitriptyline (ELAVIL) 25 MG tablet; Take 25 mg by mouth at bedtime.  Princess Bruins MD, 9:22 AM 11/06/2020

## 2020-11-07 LAB — CYTOLOGY - PAP: Diagnosis: NEGATIVE

## 2021-08-01 ENCOUNTER — Encounter: Payer: Self-pay | Admitting: Obstetrics & Gynecology

## 2021-08-12 ENCOUNTER — Encounter: Payer: Self-pay | Admitting: Obstetrics & Gynecology

## 2021-08-21 ENCOUNTER — Other Ambulatory Visit: Payer: Self-pay | Admitting: Radiology

## 2021-08-26 ENCOUNTER — Encounter: Payer: Self-pay | Admitting: Obstetrics & Gynecology

## 2021-09-23 ENCOUNTER — Other Ambulatory Visit: Payer: Self-pay

## 2021-09-23 DIAGNOSIS — Z13820 Encounter for screening for osteoporosis: Secondary | ICD-10-CM

## 2021-11-07 ENCOUNTER — Ambulatory Visit (INDEPENDENT_AMBULATORY_CARE_PROVIDER_SITE_OTHER): Payer: BC Managed Care – PPO | Admitting: Obstetrics & Gynecology

## 2021-11-07 ENCOUNTER — Encounter: Payer: Self-pay | Admitting: Obstetrics & Gynecology

## 2021-11-07 VITALS — BP 118/84 | HR 100 | Resp 16 | Ht 59.75 in | Wt 153.0 lb

## 2021-11-07 DIAGNOSIS — Z01419 Encounter for gynecological examination (general) (routine) without abnormal findings: Secondary | ICD-10-CM

## 2021-11-07 DIAGNOSIS — Z780 Asymptomatic menopausal state: Secondary | ICD-10-CM

## 2021-11-07 DIAGNOSIS — M8580 Other specified disorders of bone density and structure, unspecified site: Secondary | ICD-10-CM | POA: Diagnosis not present

## 2021-11-07 NOTE — Progress Notes (Signed)
[place] 1966-04-27 [id number]   History:    56 y.o.  G1P1L1 Married.  Son finished Junior year in [place].     RP:  Established patient presenting for annual gyn exam    HPI: Postmenopause, well on no HRT.  No PMB.  No pelvic pain.  Not sexually active recently. Pap 10/2020 Neg.  No h/o abnormal Pap. Repeat Pap at 2-3 years.  Fibromyalgia worse x 07/2018, not working.  Urine/BMs normal. Mild SUI.  Soft stools making it difficult to clean and needs to go back a second time to finish her BM.  Breasts normal.  Mammo 07/31/21.  Left Dx mammo 08/08/21.  Lt Breast Bx 08/21/21 Benign.  BMI much improved at 30.13. Health labs with [facility] 7 yrs ago, will repeat at 38 yrs.  BMD: 11-22-19 Osteopenia with T-Score -2.0 at the Left Femoral Neck.  Repeat BD in 11/2021.  Past medical history,surgical history, family history and social history were all reviewed and documented in the EPIC chart.  Gynecologic History Patient's last menstrual period was 04/09/2012.  Obstetric History OB History  Gravida Para Term Preterm AB Living  1       0 1  SAB IAB Ectopic Multiple Live Births      0        # Outcome Date GA Lbr Len/2nd Weight Sex Delivery Anes PTL Lv  1 Gravida              ROS: A ROS was performed and pertinent positives and negatives are included in the history.  GENERAL: No fevers or chills. HEENT: No change in vision, no earache, sore throat or sinus congestion. NECK: No pain or stiffness. CARDIOVASCULAR: No chest pain or pressure. No palpitations. PULMONARY: No shortness of breath, cough or wheeze. GASTROINTESTINAL: No abdominal pain, nausea, vomiting or diarrhea, melena or bright red blood per rectum. GENITOURINARY: No urinary frequency, urgency, hesitancy or dysuria. MUSCULOSKELETAL: No joint or muscle pain, no back pain, no recent trauma. DERMATOLOGIC: No rash, no itching, no lesions. ENDOCRINE: No polyuria, polydipsia, no heat or cold intolerance. No recent change in weight.  HEMATOLOGICAL: No anemia or easy bruising or bleeding. NEUROLOGIC: No headache, seizures, numbness, tingling or weakness. PSYCHIATRIC: No depression, no loss of interest in normal activity or change in sleep pattern.     Exam:   BP 118/84   Pulse 100   Resp 16   Ht 4' 11.75" (1.518 m)   Wt 153 lb (69.4 kg)   LMP 04/09/2012   BMI 30.13 kg/m   Body mass index is 30.13 kg/m.  General appearance : Well developed well nourished female. No acute distress HEENT: Eyes: no retinal hemorrhage or exudates,  Neck supple, trachea midline, no carotid bruits, no thyroidmegaly Lungs: Clear to auscultation, no rhonchi or wheezes, or rib retractions  Heart: Regular rate and rhythm, no murmurs or gallops Breast:Examined in sitting and supine position were symmetrical in appearance, no palpable masses or tenderness,  no skin retraction, no nipple inversion, no nipple discharge, no skin discoloration, no axillary or supraclavicular lymphadenopathy Abdomen: no palpable masses or tenderness, no rebound or guarding Extremities: no edema or skin discoloration or tenderness  Pelvic: Vulva: Normal             Vagina: No gross lesions or discharge  Cervix: No gross lesions or discharge  Uterus  AV, normal size, shape and consistency, non-tender and mobile  Adnexa  Without masses or tenderness  Anus: Normal  Assessment/Plan:  56 y.o. female for annual exam   1. Well female exam with routine gynecological exam Postmenopause, well on no HRT.  No PMB.  No pelvic pain.  Not sexually active recently. Pap 10/2020 Neg.  No h/o abnormal Pap. Repeat Pap at 2-3 years.  Fibromyalgia worse x 07/2018, not working.  Urine/BMs normal. Mild SUI.  Soft stools making it difficult to clean and needs to go back a second time to finish her BM. Will modify diet to improve her stool consistency.  Kegels for the anal sphincter instructed.  Breasts normal.  Mammo 07/31/21.  Left Dx mammo 08/08/21.  Lt Breast Bx 08/21/21 Benign.  BMI much  improved at 30.13. Health labs with [facility] 7 yrs ago, will repeat at 66 yrs.  BMD: 11-22-19 Osteopenia with T-Score -2.0 at the Left Femoral Neck.  Repeat BD in 11/2021.  2. Postmenopause Postmenopause, well on no HRT.  No PMB.  No pelvic pain.  Not sexually active recently.  3. Osteopenia, unspecified location BMD: 11-22-19 Osteopenia with T-Score -2.0 at the Left Femoral Neck.  Repeat BD in 11/2021, already scheduled here.  Ca++ 1.5 g/d in food, Vit D supplement, regular weight bearing physical activities.  Other orders - gabapentin (NEURONTIN) 600 MG tablet; Take by mouth 2 (two) times daily. - valACYclovir (VALTREX) 1000 MG tablet; Take 1,000 mg by mouth 2 (two) times daily. - tiZANidine (ZANAFLEX) 4 MG tablet; Take 4 mg by mouth every 8 (eight) hours as needed for muscle spasms.   Princess Bruins MD, 1:42 PM 11/07/2021

## 2021-12-03 ENCOUNTER — Other Ambulatory Visit: Payer: Self-pay | Admitting: Obstetrics & Gynecology

## 2021-12-03 ENCOUNTER — Ambulatory Visit (INDEPENDENT_AMBULATORY_CARE_PROVIDER_SITE_OTHER): Payer: BC Managed Care – PPO

## 2021-12-03 DIAGNOSIS — Z13820 Encounter for screening for osteoporosis: Secondary | ICD-10-CM

## 2021-12-03 DIAGNOSIS — M8589 Other specified disorders of bone density and structure, multiple sites: Secondary | ICD-10-CM

## 2021-12-03 DIAGNOSIS — Z780 Asymptomatic menopausal state: Secondary | ICD-10-CM | POA: Diagnosis not present

## 2021-12-12 DIAGNOSIS — R69 Illness, unspecified: Secondary | ICD-10-CM | POA: Diagnosis not present

## 2021-12-31 DIAGNOSIS — R69 Illness, unspecified: Secondary | ICD-10-CM | POA: Diagnosis not present

## 2022-02-11 DIAGNOSIS — R69 Illness, unspecified: Secondary | ICD-10-CM | POA: Diagnosis not present

## 2022-02-25 DIAGNOSIS — M47812 Spondylosis without myelopathy or radiculopathy, cervical region: Secondary | ICD-10-CM | POA: Diagnosis not present

## 2022-02-25 DIAGNOSIS — R69 Illness, unspecified: Secondary | ICD-10-CM | POA: Diagnosis not present

## 2022-02-25 DIAGNOSIS — M797 Fibromyalgia: Secondary | ICD-10-CM | POA: Diagnosis not present

## 2022-03-04 DIAGNOSIS — R69 Illness, unspecified: Secondary | ICD-10-CM | POA: Diagnosis not present

## 2022-03-12 DIAGNOSIS — R922 Inconclusive mammogram: Secondary | ICD-10-CM | POA: Diagnosis not present

## 2022-03-12 DIAGNOSIS — R928 Other abnormal and inconclusive findings on diagnostic imaging of breast: Secondary | ICD-10-CM | POA: Diagnosis not present

## 2022-03-13 ENCOUNTER — Encounter: Payer: Self-pay | Admitting: Obstetrics & Gynecology

## 2022-03-19 ENCOUNTER — Encounter: Payer: Self-pay | Admitting: Internal Medicine

## 2022-03-19 ENCOUNTER — Ambulatory Visit: Payer: 59 | Attending: Internal Medicine | Admitting: Internal Medicine

## 2022-03-19 VITALS — BP 150/90 | HR 80 | Ht 60.0 in | Wt 162.4 lb

## 2022-03-19 DIAGNOSIS — E7801 Familial hypercholesterolemia: Secondary | ICD-10-CM

## 2022-03-19 DIAGNOSIS — R072 Precordial pain: Secondary | ICD-10-CM

## 2022-03-19 DIAGNOSIS — I10 Essential (primary) hypertension: Secondary | ICD-10-CM | POA: Diagnosis not present

## 2022-03-19 DIAGNOSIS — M797 Fibromyalgia: Secondary | ICD-10-CM | POA: Diagnosis not present

## 2022-03-19 MED ORDER — NITROGLYCERIN 0.4 MG SL SUBL: 0.4000 mg | SUBLINGUAL_TABLET | SUBLINGUAL | 3 refills | Status: DC | PRN

## 2022-03-19 MED ORDER — METOPROLOL TARTRATE 100 MG PO TABS: ORAL_TABLET | ORAL | 0 refills | Status: DC

## 2022-03-19 NOTE — Patient Instructions (Addendum)
Medication Instructions:  Your physician has recommended you make the following change in your medication:  START: Nitroglycerin 0.4 mg under your tongue as needed for chest pain  *If you need a refill on your cardiac medications before your next appointment, please call your pharmacy*   Lab Work: TODAY: BMP If you have labs (blood work) drawn today and your tests are completely normal, you will receive your results only by: [place] (if you have MyChart) OR A paper copy in the mail If you have any lab test that is abnormal or we need to change your treatment, we will call you to review the results.   Testing/Procedures: Your physician has requested that you have cardiac CT. Cardiac computed tomography (CT) is a painless test that uses an x-ray machine to take clear, detailed pictures of your heart. For further information please visit [email or web address]. Please follow instruction sheet as given.     Follow-Up: At [facility], you and your health needs are our priority.  As part of our continuing mission to provide you with exceptional heart care, we have created designated Provider Care Teams.  These Care Teams include your primary Cardiologist (physician) and Advanced Practice Providers (APPs -  Physician Assistants and Nurse Practitioners) who all work together to provide you with the care you need, when you need it.  We recommend signing up for the patient portal called "MyChart".  Sign up information is provided on this After Visit Summary.  MyChart is used to connect with patients for Virtual Visits (Telemedicine).  Patients are able to view lab/test results, encounter notes, upcoming appointments, etc.  Non-urgent messages can be sent to your provider as well.   To learn more about what you can do with MyChart, go to [email or web address].    Your next appointment:   3-4 month(s)  The format for your next appointment:   In Person  Provider:   Melina Copa,  PA-C, Ambrose Pancoast, NP, or Ermalinda Barrios, PA-C ,or  Werner Lean, MD    Other Instructions  Your cardiac CT will be scheduled at one of the below locations:   [facility] [address] [place], [place] [address] [phone]  [facility] [address] [place], [place] [address] [phone]  [facility] [place], [place] [address] [phone]  If scheduled at [facility], please arrive at the [facility] Entrance (Entrance C2) of [facility] 30 minutes prior to test start time. You can use the FREE valet parking offered at entrance C (encouraged to control the heart rate for the test)  Proceed to the [facility] Radiology Department (first floor) to check-in and test prep.  All radiology patients and guests should use entrance C2 at [facility], accessed from [facility], even though the hospital's physical address listed is [address]     Please follow these instructions carefully (unless otherwise directed):  On the Night Before the Test: Be sure to Drink plenty of water. Do not consume any caffeinated/decaffeinated beverages or chocolate 12 hours prior to your test. Do not take any antihistamines 12 hours prior to your test.   On the Day of the Test: Drink plenty of water until 1 hour prior to the test. Do not eat any food 1 hour prior to test. You may take your regular medications prior to the test.  Take metoprolol (Lopressor)100  mg by mouth two hours prior to test. HOLD Furosemide/Hydrochlorothiazide morning of the test. FEMALES- please wear underwire-free bra if available, avoid dresses & tight clothing        After the Test: Drink plenty of water. After receiving IV contrast, you may experience a mild flushed feeling. This is normal. On occasion, you may experience a mild rash up  to 24 hours after the test. This is not dangerous. If this occurs, you can take Benadryl 25 mg and increase your fluid intake. If you experience trouble breathing, this can be serious. If it is severe call 911 IMMEDIATELY. If it is mild, please call our office. If you take any of these medications: Glipizide/Metformin, Avandament, Glucavance, please do not take 48 hours after completing test unless otherwise instructed.  We will call to schedule your test 2-4 weeks out understanding that some insurance companies will need an authorization prior to the service being performed.   For non-scheduling related questions, please contact the cardiac imaging nurse navigator should you have any questions/concerns: Marchia Bond, Cardiac Imaging Nurse Navigator Gordy Clement, Cardiac Imaging Nurse Navigator [facility] Direct Office Dial: [phone]   For scheduling needs, including cancellations and rescheduling, please call [place], [phone].   Important Information About Sugar

## 2022-03-19 NOTE — Progress Notes (Signed)
Cardiology Office Note:    Date:  03/19/2022   ID:  [name], DOB 18-Jan-1966, MRN [id number]  PCP:  Dorothy Noon, MD   [place] [facility] Cardiologist:  Dorothy Lean, MD     Referring MD: Dorothy Noon, MD   CC: Nausea and clamminess Consulted for the evaluation of pericarditis at the behest of Dr. Melford Levine  History of Present Illness:    [name] is a 56 y.o. female with a hx of HTN, OSA formerly on CPAP- lost 45 lbs, FH, and pericarditis (2018) formerly seen by [name].  Is disabled related to fibromyalgia.  Prior history of kidney stones.  Has had episodes with nausea, and clamminess, chest pain that was band like in nature that radiated to her neck.  Lasted about 15 minutes.  She has a bulging disk and her pain felt different from her prior pericarditis, and different from her fibromyalgia.  Occurred while folding clothes.    Baseline walks with the dog and doesn't bring the pain.  No shortness of breath, DOE .  No PND or orthopnea.  No weight gain, leg swelling , or abdominal swelling.  No syncope or near syncope . Notes no palpitations or funny heart beats.     Had distant stress test that looked ok.  AMB BP is 120/80s.  Past Medical History:  Diagnosis Date   DJD (degenerative joint disease)    knees and ankles   Essential hypertension 07/12/2014   Familial hypercholesterolemia 07/16/2015   Fibromyalgia 06/23/2017   Hematuria    History of endometriosis    History of kidney stones    Hyperlipidemia    MDD (major depressive disorder) 10/21/2018   Migraine with aura and without status migrainosus, not intractable 07/16/2015   Osteopenia 11/2017   T score -2.0 FRAX 6.3% / 0.8%   Pericarditis 04/27/2017   PONV (postoperative nausea and vomiting)    Sleep apnea, obstructive 01/26/2019   Wears contact lenses     Past Surgical History:  Procedure Laterality Date   CYSTOSCOPY WITH RETROGRADE PYELOGRAM,  URETEROSCOPY AND STENT PLACEMENT Right 10/04/2014   Procedure: Harris, URETEROSCOPY ;  Surgeon: Dorothy Snare, MD;  Location: [facility];  Service: Urology;  Laterality: Right;   DILATION AND EVACUATION  1993   HOLMIUM LASER APPLICATION Right 8/78/6767   Procedure: HOLMIUM LASER APPLICATION;  Surgeon: Dorothy Snare, MD;  Location: [facility];  Service: Urology;  Laterality: Right;   KIDNEY STONE SURGERY     LAPAROSCOPY LEFT OVARIAN CYSTECTOMY  06-17-2007   and Partial Left Salpingectomy   LAPAROSCOPY RIGHT TUBAL LIGATION/  REMOVAL IUD/  HYSTEROSCOPY GYNECARE ENDOMETRIAL BALLOON ABLATION  07-14-2003   RIGHT URETEROSCPIC LASER LITHOTRIPSY STONE EXTRACTION  02-15-2007    Current Medications: Current Meds  Medication Sig   amitriptyline (ELAVIL) 25 MG tablet Take 25 mg by mouth at bedtime.   amLODipine (NORVASC) 5 MG tablet Take 5 mg by mouth every evening.    buPROPion (WELLBUTRIN XL) 150 MG 24 hr tablet Take 150 mg by mouth daily.   celecoxib (CELEBREX) 100 MG capsule Take 100 mg by mouth 2 (two) times daily.   DULoxetine (CYMBALTA) 60 MG capsule Take 1 capsule by mouth in the morning and at bedtime.   gabapentin (NEURONTIN) 600 MG tablet Take 2,400 mg by mouth 3 (three) times daily. '600mg'$  am, '600mg'$  pm, '1200mg'$  hs   metoprolol tartrate (LOPRESSOR) 100 MG tablet Take 2 hours prior to  Cardiac CT   rosuvastatin (CRESTOR) 20 MG tablet Take 20 mg by mouth every evening.   tiZANidine (ZANAFLEX) 4 MG tablet Take 4 mg by mouth every 8 (eight) hours as needed for muscle spasms.   valACYclovir (VALTREX) 1000 MG tablet Take 1,000 mg by mouth as needed (fever blister).   zolmitriptan (ZOMIG) 5 MG tablet Take 5 mg by mouth as needed for migraine.   [DISCONTINUED] nitroGLYCERIN (NITROSTAT) 0.4 MG SL tablet Place 1 tablet (0.4 mg total) under the tongue every 5 (five) minutes as needed for chest pain.     Allergies:   Bee venom, Cephalosporins,  and Penicillins   Social History   Socioeconomic History   Marital status: Married    Spouse name: Not on file   Number of children: 1   Years of education: 62   Highest education level: Master's degree (e.g., MA, MS, MEng, MEd, MSW, MBA)  Occupational History   Not on file  Tobacco Use   Smoking status: Never   Smokeless tobacco: Never  Vaping Use   Vaping Use: Never used  Substance and Sexual Activity   Alcohol use: No   Drug use: No   Sexual activity: Not Currently    Partners: Male    Birth control/protection: Post-menopausal    Comment: declined insurance questions, des neg  Other Topics Concern   Not on file  Social History Narrative   Not on file   Social Determinants of Health   Financial Resource Strain: Not on file  Food Insecurity: Not on file  Transportation Needs: Not on file  Physical Activity: Not on file  Stress: Not on file  Social Connections: Not on file     Family History: The patient's family history includes Alzheimer's disease in her mother; CAD in her father; Hyperlipidemia in her mother; Parkinson's disease in her father. Farther P Unclear, P GF, and P GM had CAD.  ROS:   Please see the history of present illness.     All other systems reviewed and are negative.  EKGs/Labs/Other Studies Reviewed:    The following studies were reviewed today:  EKG:  EKG is  ordered today.  The ekg ordered today demonstrates  03/19/22: SR with depressed PR in II and AVH no ST elevations.  ECHO COMPLETE WO IMAGING ENHANCING AGENT 01/07/2017  Narrative *[facility]* [address], [place] [address] [phone]  ------------------------------------------------------------------- Transthoracic Echocardiography  Patient:    [name] MR #:       [id number] Study Date: 01/07/2017 Gender:     F Age:        72 Height:     152.4 cm Weight:     75.8 kg BSA:        1.82 m^2 Pt.  Status: Room:  ATTENDING    [name] REFERRING    [name] REFERRING    [name] PERFORMING   [facility], Inpatient SONOGRAPHER  [name]  cc:  ------------------------------------------------------------------- LV EF: 60% -   65%  ------------------------------------------------------------------- Indications:      Chest pain 786.51.  ------------------------------------------------------------------- History:   Risk factors:  Hypertension. Dyslipidemia.  ------------------------------------------------------------------- Study Conclusions  - Left ventricle: The cavity size was normal. Wall thickness was normal. Systolic function was normal. The estimated ejection fraction was in the range of 60% to 65%. Wall motion was normal; there were no regional wall motion abnormalities. Doppler parameters are consistent with  abnormal left ventricular relaxation (grade 1 diastolic dysfunction). - Aortic valve: Poorly visualized. There was no stenosis. - Right ventricle: The cavity size was normal. Systolic function was normal. - Tricuspid valve: Peak RV-RA gradient (S): 18 mm Hg. - Pulmonary arteries: PA peak pressure: 21 mm Hg (S). - Inferior vena cava: The vessel was normal in size. The respirophasic diameter changes were in the normal range (>= 50%), consistent with normal central venous pressure.  Impressions:  - Normal LV size with EF 60-65%. Normal RV size and systolic function. No significant valvular abnormalities.  ------------------------------------------------------------------- Study data:  No prior study was available for comparison.  Study status:  Routine.  Procedure:  The patient reported no pain pre or post test. Transthoracic echocardiography. Image quality was adequate.  Study completion:  There were no complications. Transthoracic echocardiography.  M-mode, complete 2D, spectral Doppler, and  color Doppler.  Birthdate:  Patient birthdate: [birth year].  Age:  Patient is 56 yr old.  Sex:  Gender: female. BMI: 32.6 kg/m^2.  Blood pressure:     146/87  Patient status: Inpatient.  Study date:  Study date: 01/07/2017. Study time: 01:21 PM.  Location:  Echo laboratory.  -------------------------------------------------------------------  ------------------------------------------------------------------- Left ventricle:  The cavity size was normal. Wall thickness was normal. Systolic function was normal. The estimated ejection fraction was in the range of 60% to 65%. Wall motion was normal; there were no regional wall motion abnormalities. Doppler parameters are consistent with abnormal left ventricular relaxation (grade 1 diastolic dysfunction).  ------------------------------------------------------------------- Aortic valve:  Poorly visualized.  Doppler:   There was no stenosis.   There was no regurgitation.  ------------------------------------------------------------------- Aorta:  Aortic root: The aortic root was normal in size. Ascending aorta: The ascending aorta was normal in size.  ------------------------------------------------------------------- Mitral valve:   Normal thickness leaflets .  Doppler:   There was no evidence for stenosis.      Peak gradient (D): 5 mm Hg.  ------------------------------------------------------------------- Left atrium:  The atrium was normal in size.  ------------------------------------------------------------------- Right ventricle:  The cavity size was normal. Systolic function was normal.  ------------------------------------------------------------------- Pulmonic valve:    Structurally normal valve.   Cusp separation was normal.  Doppler:  Transvalvular velocity was within the normal range. There was no regurgitation.  ------------------------------------------------------------------- Tricuspid valve:   Doppler:  There was  trivial regurgitation.  ------------------------------------------------------------------- Right atrium:  The atrium was normal in size.  ------------------------------------------------------------------- Pericardium:  There was no pericardial effusion.  ------------------------------------------------------------------- Systemic veins: Inferior vena cava: The vessel was normal in size. The respirophasic diameter changes were in the normal range (>= 50%), consistent with normal central venous pressure.  ------------------------------------------------------------------- Measurements  Left ventricle                         Value        Reference LV ID, ED, PLAX chordal        (L)     40    mm     43 - 52 LV ID, ES, PLAX chordal                29    mm     23 - 38 LV fx shortening, PLAX chordal (L)     28    %      >=29 LV PW thickness, ED                    10    mm     ----------  IVS/LV PW ratio, ED                    0.7          <=1.3 Stroke volume, 2D                      67    ml     ---------- Stroke volume/bsa, 2D                  37    ml/m^2 ---------- LV e&', lateral                         8.19  cm/s   ---------- LV E/e&', lateral                       13.92        ---------- LV e&', medial                          8.27  cm/s   ---------- LV E/e&', medial                        13.78        ---------- LV e&', average                         8.23  cm/s   ---------- LV E/e&', average                       13.85        ----------  Ventricular septum                     Value        Reference IVS thickness, ED                      7     mm     ----------  LVOT                                   Value        Reference LVOT ID, S                             21    mm     ---------- LVOT area                              3.46  cm^2   ---------- LVOT peak velocity, S                  102   cm/s   ---------- LVOT mean velocity, S                  67.4  cm/s   ---------- LVOT  VTI, S                            19.3  cm     ---------- LVOT peak gradient, S  4     mm Hg  ----------  Aorta                                  Value        Reference Aortic root ID, ED                     25    mm     ----------  Left atrium                            Value        Reference LA ID, A-P, ES                         29    mm     ---------- LA ID/bsa, A-P                         1.59  cm/m^2 <=2.2 LA volume, ES, 1-p A4C                 38.7  ml     ---------- LA volume/bsa, ES, 1-p A4C             21.2  ml/m^2 ---------- LA volume, ES, 1-p A2C                 24.7  ml     ---------- LA volume/bsa, ES, 1-p A2C             13.5  ml/m^2 ----------  Mitral valve                           Value        Reference Mitral E-wave peak velocity            114   cm/s   ---------- Mitral A-wave peak velocity            56.8  cm/s   ---------- Mitral deceleration time       (L)     111   ms     150 - 230 Mitral peak gradient, D                5     mm Hg  ---------- Mitral E/A ratio, peak                 2            ----------  Pulmonary arteries                     Value        Reference PA pressure, S, DP                     21    mm Hg  <=30  Tricuspid valve                        Value        Reference Tricuspid regurg peak velocity         213   cm/s   ---------- Tricuspid peak RV-RA gradient          18    mm Hg  ----------  Right atrium                           Value        Reference RA ID, S-I, ES, A4C                    43.6  mm     34 - 49 RA area, ES, A4C                       9.01  cm^2   8.3 - 19.5 RA volume, ES, A/L                     15.5  ml     ---------- RA volume/bsa, ES, A/L                 8.5   ml/m^2 ----------  Systemic veins                         Value        Reference Estimated CVP                          3     mm Hg  ----------  Right ventricle                        Value        Reference TAPSE                                   14.3  mm     ---------- RV pressure, S, DP                     21    mm Hg  <=30 RV s&', lateral, S                      13.8  cm/s   ----------  Legend: (L)  and  (H)  mark values outside specified reference range.  ------------------------------------------------------------------- Prepared and Electronically Authenticated by  Loralie Champagne, M.D. 2018-08-01T18:09:16    Recent Labs: No results found for requested labs within last 365 days.  Recent Lipid Panel No results found for: "CHOL", "TRIG", "HDL", "CHOLHDL", "VLDL", "LDLCALC", "LDLDIRECT"   Risk Assessment/Calculations:    HYPERTENSION CONTROL Vitals:   03/19/22 1442 03/19/22 1520  BP: (!) 152/92 (!) 150/90    The patient's blood pressure is elevated above target today.  In order to address the patient's elevated BP: The blood pressure is usually elevated in clinic.  Blood pressures monitored at home have been optimal.; A current anti-hypertensive medication was adjusted today.; Blood pressure will be monitored at home to determine if medication changes need to be made.            Physical Exam:    VS:  BP (!) 150/90   Pulse 80   Ht 5' (1.524 m)   Wt 162 lb 6.4 oz (73.7 kg)   LMP 04/09/2012   SpO2 96%   BMI 31.72 kg/m     Wt Readings from Last 3 Encounters:  03/19/22 162 lb 6.4 oz (73.7 kg)  11/07/21 153 lb (69.4 kg)  11/06/20 197 lb (89.4 kg)  GEN:  Well nourished, well developed in no acute distress HEENT: Normal NECK: No JVD LYMPHATICS: No lymphadenopathy CARDIAC: RRR, no murmurs, rubs, gallops RESPIRATORY:  Clear to auscultation without rales, wheezing or rhonchi  ABDOMEN: Soft, non-tender, non-distended MUSCULOSKELETAL:  No edema; No deformity  SKIN: Warm and dry NEUROLOGIC:  Alert and oriented x 3 PSYCHIATRIC:  Normal affect   ASSESSMENT:    1. Precordial pain   2. Familial hypercholesterolemia   3. Essential hypertension   4. Fibromyalgia    PLAN:    Precordial CP Complicated  by Fibromyalgia Complicated by FH, HTN and family history of CAD - will give PRN nitro -will get Cardiac CT with 100 mg metorpolol - continue CCB for now; continue statin - continue amb BP, if elevated we will add coreg 6.25 mg PO BID  3/4 months me or APP      Medication Adjustments/Labs and Tests Ordered: Current medicines are reviewed at length with the patient today.  Concerns regarding medicines are outlined above.  Orders Placed This Encounter  Procedures   CT CORONARY MORPH W/CTA COR W/SCORE W/CA W/CM &/OR WO/CM   Basic metabolic panel   EKG 72-CNOB   Meds ordered this encounter  Medications   DISCONTD: nitroGLYCERIN (NITROSTAT) 0.4 MG SL tablet    Sig: Place 1 tablet (0.4 mg total) under the tongue every 5 (five) minutes as needed for chest pain.    Dispense:  90 tablet    Refill:  3   metoprolol tartrate (LOPRESSOR) 100 MG tablet    Sig: Take 2 hours prior to Cardiac CT    Dispense:  1 tablet    Refill:  0   nitroGLYCERIN (NITROSTAT) 0.4 MG SL tablet    Sig: Place 1 tablet (0.4 mg total) under the tongue every 5 (five) minutes as needed for chest pain.    Dispense:  25 tablet    Refill:  3    Patient Instructions  Medication Instructions:  Your physician has recommended you make the following change in your medication:  START: Nitroglycerin 0.4 mg under your tongue as needed for chest pain  *If you need a refill on your cardiac medications before your next appointment, please call your pharmacy*   Lab Work: TODAY: BMP If you have labs (blood work) drawn today and your tests are completely normal, you will receive your results only by: [place] (if you have MyChart) OR A paper copy in the mail If you have any lab test that is abnormal or we need to change your treatment, we will call you to review the results.   Testing/Procedures: Your physician has requested that you have cardiac CT. Cardiac computed tomography (CT) is a painless test that uses an  x-ray machine to take clear, detailed pictures of your heart. For further information please visit [email or web address]. Please follow instruction sheet as given.     Follow-Up: At [facility], you and your health needs are our priority.  As part of our continuing mission to provide you with exceptional heart care, we have created designated Provider Care Teams.  These Care Teams include your primary Cardiologist (physician) and Advanced Practice Providers (APPs -  Physician Assistants and Nurse Practitioners) who all work together to provide you with the care you need, when you need it.  We recommend signing up for the patient portal called "MyChart".  Sign up information is provided on this After Visit Summary.  MyChart is used to connect with patients for Virtual Visits (Telemedicine).  Patients are able to view lab/test results, encounter notes, upcoming appointments, etc.  Non-urgent messages can be sent to your provider as well.   To learn more about what you can do with MyChart, go to [email or web address].    Your next appointment:   3-4 month(s)  The format for your next appointment:   In Person  Provider:   Melina Copa, PA-C, Ambrose Pancoast, NP, or Ermalinda Barrios, PA-C ,or  Dorothy Lean, MD    Other Instructions  Your cardiac CT will be scheduled at one of the below locations:   [facility] [address] [place], [place] [address] [phone]  [place] [address] [place], [place] [address] [phone]  [facility] [place], [place] [address] [phone]  If scheduled at [facility], please arrive at the [facility] Entrance (Entrance C2) of [facility] 30 minutes prior to test start time. You can use the FREE valet parking offered at entrance C (encouraged to control the heart rate for the test)  Proceed  to the [facility] Radiology Department (first floor) to check-in and test prep.  All radiology patients and guests should use entrance C2 at [facility], accessed from [facility], even though the hospital's physical address listed is [address]     Please follow these instructions carefully (unless otherwise directed):  On the Night Before the Test: Be sure to Drink plenty of water. Do not consume any caffeinated/decaffeinated beverages or chocolate 12 hours prior to your test. Do not take any antihistamines 12 hours prior to your test.   On the Day of the Test: Drink plenty of water until 1 hour prior to the test. Do not eat any food 1 hour prior to test. You may take your regular medications prior to the test.  Take metoprolol (Lopressor)100 mg by mouth two hours prior to test. HOLD Furosemide/Hydrochlorothiazide morning of the test. FEMALES- please wear underwire-free bra if available, avoid dresses & tight clothing        After the Test: Drink plenty of water. After receiving IV contrast, you may experience a mild flushed feeling. This is normal. On occasion, you may experience a mild rash up to 24 hours after the test. This is not dangerous. If this occurs, you can take Benadryl 25 mg and increase your fluid intake. If you experience trouble breathing, this can be serious. If it is severe call 911 IMMEDIATELY. If it is mild, please call our office. If you take any of these medications: Glipizide/Metformin, Avandament, Glucavance, please do not take 48 hours after completing test unless otherwise instructed.  We will call to schedule your test 2-4 weeks out understanding that some insurance companies will need an authorization prior to the service being performed.   For non-scheduling related questions, please contact the cardiac imaging nurse navigator should you have any questions/concerns: Marchia Bond, Cardiac Imaging Nurse Navigator Gordy Clement, Cardiac Imaging Nurse Navigator [facility] Direct Office Dial: [phone]   For scheduling needs, including cancellations and rescheduling, please call [place], [phone].   Important Information About Sugar         Signed, Dorothy Lean, MD  03/19/2022 4:18 PM    [place]

## 2022-03-20 LAB — BASIC METABOLIC PANEL
BUN/Creatinine Ratio: 20 (ref 9–23)
BUN: 16 mg/dL (ref 6–24)
CO2: 24 mmol/L (ref 20–29)
Calcium: 9.6 mg/dL (ref 8.7–10.2)
Chloride: 101 mmol/L (ref 96–106)
Creatinine, Ser: 0.82 mg/dL (ref 0.57–1.00)
Glucose: 88 mg/dL (ref 70–99)
Potassium: 4.6 mmol/L (ref 3.5–5.2)
Sodium: 142 mmol/L (ref 134–144)
eGFR: 83 mL/min/{1.73_m2} (ref >59)

## 2022-03-25 DIAGNOSIS — N6489 Other specified disorders of breast: Secondary | ICD-10-CM | POA: Diagnosis not present

## 2022-04-01 ENCOUNTER — Telehealth (HOSPITAL_COMMUNITY): Payer: Self-pay | Admitting: Emergency Medicine

## 2022-04-01 NOTE — Telephone Encounter (Signed)
Attempted to call patient regarding upcoming cardiac CT appointment. °Left message on voicemail with name and callback number °Merrie Epler RN Navigator Cardiac Imaging °[facility] °[phone] Office °[phone] Cell ° °

## 2022-04-01 NOTE — Telephone Encounter (Signed)
Reaching out to patient to offer assistance regarding upcoming cardiac imaging study; pt verbalizes understanding of appt date/time, parking situation and where to check in, pre-test NPO status and medications ordered, and verified current allergies; name and call back number provided for further questions should they arise Marchia Bond RN Navigator Cardiac Imaging [facility] [phone] office [phone] cell  Arrive [address] entrance Tiny veins '100mg'$  metoprolol tartrate

## 2022-04-02 ENCOUNTER — Ambulatory Visit (HOSPITAL_COMMUNITY)
Admission: RE | Admit: 2022-04-02 | Discharge: 2022-04-02 | Disposition: A | Discharge status: Home / Self Care | Payer: 59 | Source: Ambulatory Visit | Attending: Internal Medicine | Admitting: Internal Medicine

## 2022-04-02 DIAGNOSIS — R072 Precordial pain: Secondary | ICD-10-CM | POA: Diagnosis not present

## 2022-04-02 MED ORDER — NITROGLYCERIN 0.4 MG SL SUBL
0.8000 mg | SUBLINGUAL_TABLET | Freq: Once | SUBLINGUAL | Status: AC
  Administered 2022-04-02: 0.8 mg via SUBLINGUAL

## 2022-04-02 MED ORDER — NITROGLYCERIN 0.4 MG SL SUBL
SUBLINGUAL_TABLET | SUBLINGUAL | Status: AC
  Filled 2022-04-02: qty 2

## 2022-04-02 MED ORDER — IOHEXOL 350 MG/ML SOLN
100.0000 mL | Freq: Once | INTRAVENOUS | Status: AC | PRN
  Administered 2022-04-02: 100 mL via INTRAVENOUS

## 2022-04-15 DIAGNOSIS — R69 Illness, unspecified: Secondary | ICD-10-CM | POA: Diagnosis not present

## 2022-04-15 DIAGNOSIS — F4322 Adjustment disorder with anxiety: Secondary | ICD-10-CM | POA: Diagnosis not present

## 2022-05-08 DIAGNOSIS — R69 Illness, unspecified: Secondary | ICD-10-CM | POA: Diagnosis not present

## 2022-05-19 ENCOUNTER — Encounter: Payer: Self-pay | Admitting: Internal Medicine

## 2022-05-22 DIAGNOSIS — F4322 Adjustment disorder with anxiety: Secondary | ICD-10-CM | POA: Diagnosis not present

## 2022-05-22 DIAGNOSIS — R69 Illness, unspecified: Secondary | ICD-10-CM | POA: Diagnosis not present

## 2022-06-24 DIAGNOSIS — R69 Illness, unspecified: Secondary | ICD-10-CM | POA: Diagnosis not present

## 2022-06-24 DIAGNOSIS — F4322 Adjustment disorder with anxiety: Secondary | ICD-10-CM | POA: Diagnosis not present

## 2022-07-08 DIAGNOSIS — F4322 Adjustment disorder with anxiety: Secondary | ICD-10-CM | POA: Diagnosis not present

## 2022-07-08 DIAGNOSIS — R69 Illness, unspecified: Secondary | ICD-10-CM | POA: Diagnosis not present

## 2022-07-10 ENCOUNTER — Ambulatory Visit: Payer: 59 | Admitting: Internal Medicine

## 2022-07-22 DIAGNOSIS — F4322 Adjustment disorder with anxiety: Secondary | ICD-10-CM | POA: Diagnosis not present

## 2022-07-22 DIAGNOSIS — R69 Illness, unspecified: Secondary | ICD-10-CM | POA: Diagnosis not present

## 2022-07-24 ENCOUNTER — Ambulatory Visit: Payer: 59 | Admitting: Internal Medicine

## 2022-08-13 DIAGNOSIS — R928 Other abnormal and inconclusive findings on diagnostic imaging of breast: Secondary | ICD-10-CM | POA: Diagnosis not present

## 2022-08-13 DIAGNOSIS — N6489 Other specified disorders of breast: Secondary | ICD-10-CM | POA: Diagnosis not present

## 2022-08-14 ENCOUNTER — Encounter: Payer: Self-pay | Admitting: Obstetrics & Gynecology

## 2022-08-19 NOTE — Progress Notes (Unsigned)
Cardiology Office Note:    Date:  08/20/2022   ID:  SALVATRICE SUPPLE, DOB [birth year], MRN [id number]  PCP:  Chesley Noon, MD   [place] Providers Cardiologist:  Werner Lean, MD     Referring MD: Chesley Noon, MD   CC: Nausea and clamminess  History of Present Illness:    [name] is a 57 y.o. female with a hx of HTN, OSA formerly on CPAP- lost 45 lbs, FH, and pericarditis (2018) formerly seen by [facility].  Is disabled related to fibromyalgia.  Prior history of kidney stones. 2023: Negative cardiac work up  Patient notes that she is having good and bad days..   Since last visit notes having a bit of BP elevation, and calmed down on repeat. There are no interval hospital/ED visit.    No chest pain or pressure.  No SOB/DOE and no PND/Orthopnea.  No weight gain or leg swelling.   Past Medical History:  Diagnosis Date   DJD (degenerative joint disease)    knees and ankles   Essential hypertension 07/12/2014   Familial hypercholesterolemia 07/16/2015   Fibromyalgia 06/23/2017   Hematuria    History of endometriosis    History of kidney stones    Hyperlipidemia    MDD (major depressive disorder) 10/21/2018   Migraine with aura and without status migrainosus, not intractable 07/16/2015   Osteopenia 11/2017   T score -2.0 FRAX 6.3% / 0.8%   Pericarditis 04/27/2017   PONV (postoperative nausea and vomiting)    Sleep apnea, obstructive 01/26/2019   Wears contact lenses     Past Surgical History:  Procedure Laterality Date   CYSTOSCOPY WITH RETROGRADE PYELOGRAM, URETEROSCOPY AND STENT PLACEMENT Right 10/04/2014   Procedure: [place], URETEROSCOPY ;  Surgeon: Rana Snare, MD;  Location: [facility];  Service: Urology;  Laterality: Right;   DILATION AND EVACUATION  1993   HOLMIUM LASER APPLICATION Right [id number]   Procedure: HOLMIUM LASER APPLICATION;  Surgeon: Rana Snare, MD;   Location: [facility];  Service: Urology;  Laterality: Right;   KIDNEY STONE SURGERY     LAPAROSCOPY LEFT OVARIAN CYSTECTOMY  06-17-2007   and Partial Left Salpingectomy   LAPAROSCOPY RIGHT TUBAL LIGATION/  REMOVAL IUD/  HYSTEROSCOPY GYNECARE ENDOMETRIAL BALLOON ABLATION  07-14-2003   RIGHT URETEROSCPIC LASER LITHOTRIPSY STONE EXTRACTION  02-15-2007    Current Medications: Current Meds  Medication Sig   amitriptyline (ELAVIL) 25 MG tablet Take 25 mg by mouth at bedtime.   amLODipine (NORVASC) 5 MG tablet Take 5 mg by mouth every evening.    buPROPion (WELLBUTRIN XL) 150 MG 24 hr tablet Take 150 mg by mouth daily.   celecoxib (CELEBREX) 100 MG capsule Take 100 mg by mouth 2 (two) times daily.   DULoxetine (CYMBALTA) 60 MG capsule Take 1 capsule by mouth in the morning and at bedtime.   gabapentin (NEURONTIN) 600 MG tablet Take 2,400 mg by mouth 3 (three) times daily. '600mg'$  am, '600mg'$  pm, '1200mg'$  hs   nitroGLYCERIN (NITROSTAT) 0.4 MG SL tablet Place 1 tablet (0.4 mg total) under the tongue every 5 (five) minutes as needed for chest pain.   rosuvastatin (CRESTOR) 20 MG tablet Take 20 mg by mouth every evening.   tiZANidine (ZANAFLEX) 4 MG tablet Take 4 mg by mouth every 8 (eight) hours as needed for muscle spasms.   valACYclovir (VALTREX) 1000 MG tablet Take 1,000 mg by mouth as needed (fever blister).   zolmitriptan (  ZOMIG) 5 MG tablet Take 5 mg by mouth as needed for migraine.     Allergies:   Bee venom, Cephalosporins, and Penicillins   Social History   Socioeconomic History   Marital status: Married    Spouse name: Not on file   Number of children: 1   Years of education: 69   Highest education level: Master's degree (e.g., MA, MS, MEng, MEd, MSW, MBA)  Occupational History   Not on file  Tobacco Use   Smoking status: Never   Smokeless tobacco: Never  Vaping Use   Vaping Use: Never used  Substance and Sexual Activity   Alcohol use: No   Drug use: No   Sexual  activity: Not Currently    Partners: Male    Birth control/protection: Post-menopausal    Comment: declined insurance questions, des neg  Other Topics Concern   Not on file  Social History Narrative   Not on file   Social Determinants of Health   Financial Resource Strain: Not on file  Food Insecurity: Not on file  Transportation Needs: Not on file  Physical Activity: Not on file  Stress: Not on file  Social Connections: Not on file     Family History: The patient's family history includes Alzheimer's disease in her mother; CAD in her father; Hyperlipidemia in her mother; Parkinson's disease in her father. Farther P Unclear, P GF, and P GM had CAD.  ROS:   Please see the history of present illness.     All other systems reviewed and are negative.  EKGs/Labs/Other Studies Reviewed:    The following studies were reviewed today:  EKG:   03/19/22: SR with depressed PR in II and AVH no ST elevations.   Cardiac Studies & Procedures     STRESS TESTS  NM MYOCAR MULTI W/SPECT W 01/08/2017  Narrative  There was no ST segment deviation noted during stress.  No T wave inversion was noted during stress.  The study is normal.  This is a low risk study.  The left ventricular ejection fraction is normal (55-65%).  Normal resting and stress perfusion. No ischemia or infarction EF 57%   ECHOCARDIOGRAM  ECHOCARDIOGRAM COMPLETE 01/07/2017  Narrative *[facility]* [address], [place] [address] [phone]  ------------------------------------------------------------------- Transthoracic Echocardiography  Patient:    Skylur, Zoppi MR #:       [id number] Study Date: 01/07/2017 Gender:     F Age:        74 Height:     152.4 cm Weight:     75.8 kg BSA:        1.82 m^2 Pt. Status: Room:  ATTENDING    Annia Belt REFERRING    Lou Miner REFERRING    Horton, Barbette Hair PERFORMING   [facility], Inpatient SONOGRAPHER  Mikki Santee  cc:  ------------------------------------------------------------------- LV EF: 60% -   65%  ------------------------------------------------------------------- Indications:      Chest pain 786.51.  ------------------------------------------------------------------- History:   Risk factors:  Hypertension. Dyslipidemia.  ------------------------------------------------------------------- Study Conclusions  - Left ventricle: The cavity size was normal. Wall thickness was normal. Systolic function was normal. The estimated ejection fraction was in the range of 60% to 65%. Wall motion was normal; there were no regional wall motion abnormalities. Doppler parameters are consistent with abnormal left ventricular relaxation (grade 1 diastolic dysfunction). - Aortic valve: Poorly visualized. There was no stenosis. - Right ventricle: The cavity  size was normal. Systolic function was normal. - Tricuspid valve: Peak RV-RA gradient (S): 18 mm Hg. - Pulmonary arteries: PA peak pressure: 21 mm Hg (S). - Inferior vena cava: The vessel was normal in size. The respirophasic diameter changes were in the normal range (>= 50%), consistent with normal central venous pressure.  Impressions:  - Normal LV size with EF 60-65%. Normal RV size and systolic function. No significant valvular abnormalities.  ------------------------------------------------------------------- Study data:  No prior study was available for comparison.  Study status:  Routine.  Procedure:  The patient reported no pain pre or post test. Transthoracic echocardiography. Image quality was adequate.  Study completion:  There were no complications. Transthoracic echocardiography.  M-mode, complete 2D, spectral Doppler, and color Doppler.  Birthdate:  Patient birthdate: 1966-04-13.  Age:  Patient is 57 yr old.  Sex:  Gender: female. BMI: 32.6 kg/m^2.  Blood pressure:      146/87  Patient status: Inpatient.  Study date:  Study date: 01/07/2017. Study time: 01:21 PM.  Location:  Echo laboratory.  -------------------------------------------------------------------  ------------------------------------------------------------------- Left ventricle:  The cavity size was normal. Wall thickness was normal. Systolic function was normal. The estimated ejection fraction was in the range of 60% to 65%. Wall motion was normal; there were no regional wall motion abnormalities. Doppler parameters are consistent with abnormal left ventricular relaxation (grade 1 diastolic dysfunction).  ------------------------------------------------------------------- Aortic valve:  Poorly visualized.  Doppler:   There was no stenosis.   There was no regurgitation.  ------------------------------------------------------------------- Aorta:  Aortic root: The aortic root was normal in size. Ascending aorta: The ascending aorta was normal in size.  ------------------------------------------------------------------- Mitral valve:   Normal thickness leaflets .  Doppler:   There was no evidence for stenosis.      Peak gradient (D): 5 mm Hg.  ------------------------------------------------------------------- Left atrium:  The atrium was normal in size.  ------------------------------------------------------------------- Right ventricle:  The cavity size was normal. Systolic function was normal.  ------------------------------------------------------------------- Pulmonic valve:    Structurally normal valve.   Cusp separation was normal.  Doppler:  Transvalvular velocity was within the normal range. There was no regurgitation.  ------------------------------------------------------------------- Tricuspid valve:   Doppler:  There was trivial regurgitation.  ------------------------------------------------------------------- Right atrium:  The atrium was normal in  size.  ------------------------------------------------------------------- Pericardium:  There was no pericardial effusion.  ------------------------------------------------------------------- Systemic veins: Inferior vena cava: The vessel was normal in size. The respirophasic diameter changes were in the normal range (>= 50%), consistent with normal central venous pressure.  ------------------------------------------------------------------- Measurements  Left ventricle                         Value        Reference LV ID, ED, PLAX chordal        (L)     40    mm     43 - 52 LV ID, ES, PLAX chordal                29    mm     23 - 38 LV fx shortening, PLAX chordal (L)     28    %      >=29 LV PW thickness, ED                    10    mm     ---------- IVS/LV PW ratio, ED  0.7          <=1.3 Stroke volume, 2D                      67    ml     ---------- Stroke volume/bsa, 2D                  37    ml/m^2 ---------- LV e&', lateral                         8.19  cm/s   ---------- LV E/e&', lateral                       13.92        ---------- LV e&', medial                          8.27  cm/s   ---------- LV E/e&', medial                        13.78        ---------- LV e&', average                         8.23  cm/s   ---------- LV E/e&', average                       13.85        ----------  Ventricular septum                     Value        Reference IVS thickness, ED                      7     mm     ----------  LVOT                                   Value        Reference LVOT ID, S                             21    mm     ---------- LVOT area                              3.46  cm^2   ---------- LVOT peak velocity, S                  102   cm/s   ---------- LVOT mean velocity, S                  67.4  cm/s   ---------- LVOT VTI, S                            19.3  cm     ---------- LVOT peak gradient, S                  4     mm Hg  ----------  Aorta  Value        Reference Aortic root ID, ED                     25    mm     ----------  Left atrium                            Value        Reference LA ID, A-P, ES                         29    mm     ---------- LA ID/bsa, A-P                         1.59  cm/m^2 <=2.2 LA volume, ES, 1-p A4C                 38.7  ml     ---------- LA volume/bsa, ES, 1-p A4C             21.2  ml/m^2 ---------- LA volume, ES, 1-p A2C                 24.7  ml     ---------- LA volume/bsa, ES, 1-p A2C             13.5  ml/m^2 ----------  Mitral valve                           Value        Reference Mitral E-wave peak velocity            114   cm/s   ---------- Mitral A-wave peak velocity            56.8  cm/s   ---------- Mitral deceleration time       (L)     111   ms     150 - 230 Mitral peak gradient, D                5     mm Hg  ---------- Mitral E/A ratio, peak                 2            ----------  Pulmonary arteries                     Value        Reference PA pressure, S, DP                     21    mm Hg  <=30  Tricuspid valve                        Value        Reference Tricuspid regurg peak velocity         213   cm/s   ---------- Tricuspid peak RV-RA gradient          18    mm Hg  ----------  Right atrium                           Value        Reference RA ID, S-I, ES, A4C  43.6  mm     34 - 49 RA area, ES, A4C                       9.01  cm^2   8.3 - 19.5 RA volume, ES, A/L                     15.5  ml     ---------- RA volume/bsa, ES, A/L                 8.5   ml/m^2 ----------  Systemic veins                         Value        Reference Estimated CVP                          3     mm Hg  ----------  Right ventricle                        Value        Reference TAPSE                                  14.3  mm     ---------- RV pressure, S, DP                     21    mm Hg  <=30 RV s&', lateral, S                      13.8  cm/s    ----------  Legend: (L)  and  (H)  mark values outside specified reference range.  ------------------------------------------------------------------- Prepared and Electronically Authenticated by  Loralie Champagne, M.D. 2018-08-01T18:09:16     CT SCANS  CT CORONARY MORPH W/CTA COR W/SCORE 04/02/2022  Addendum 04/02/2022  4:12 PM ADDENDUM REPORT: 04/02/2022 16:10  EXAM: OVER-READ INTERPRETATION  CT CHEST  The following report is an over-read performed by radiologist Dr. Alvino Blood [facility] on 04/02/2022. This over-read does not include interpretation of cardiac or coronary anatomy or pathology. The CTA interpretation by the cardiologist is attached.  COMPARISON:  None.  FINDINGS: Limited view of the lung parenchyma demonstrates eight. Airways are normal.  Limited view of the mediastinum demonstrates no adenopathy. Esophagus normal.  Limited view of the upper abdomen unremarkable.  Limited view of the skeleton and chest wall is unremarkable.  IMPRESSION: No significant extracardiac findings.   Electronically Signed By: Suzy Bouchard M.D. On: 04/02/2022 16:10  Narrative CLINICAL DATA:  57 year old with chest pain  EXAM: Cardiac/Coronary  CTA  TECHNIQUE: The patient was scanned on a [employer].  FINDINGS: A 120 kV prospective scan was triggered in the descending thoracic aorta at 111 HU's. Axial non-contrast 3 mm slices were carried out through the heart. The data set was analyzed on a dedicated work station and scored using the [employer]. Gantry rotation speed was 250 msecs and collimation was .6 mm. 0.8 mg of sl NTG was given. The 3D data set was reconstructed in 5% intervals of the 67-82 % of the R-R cycle. Diastolic phases were analyzed on a dedicated work station using MPR, MIP and VRT modes. The patient received 80  cc of contrast.  Image quality: good  Aorta:  Normal size.  No calcifications.  No  dissection.  Aortic Valve:  Trileaflet.  No calcifications.  Coronary Arteries:  Normal coronary origin.  Right dominance.  RCA is a large dominant artery that gives rise to PDA and PLA. There is no plaque.  Left main is a large artery that gives rise to LAD and LCX arteries.  LAD is a large vessel that has no plaque. There is mild luminal narrowing (<20% stenosis) at bifurcation of first septal branch.  D1 - small caliber, normal  D2 - very small caliber, normal  LCX is a non-dominant artery that gives rise to one large OM1 branch. There is no plaque.  Other findings:  Normal pulmonary vein drainage into the left atrium.  Normal left atrial appendage without a thrombus.  Normal size of the pulmonary artery.  Please see radiology report for non cardiac findings.  IMPRESSION: 1. Coronary calcium score of 0.  2. Normal coronary origin with right dominance.  3. No evidence of CAD.  CAD-RADS 0. No evidence of CAD (0%). Consider non-atherosclerotic causes of chest pain.  Electronically Signed: By: Candee Furbish M.D. On: 04/02/2022 15:51           Recent Labs: 03/19/2022: BUN 16; Creatinine, Ser 0.82; Potassium 4.6; Sodium 142  Recent Lipid Panel No results found for: "CHOL", "TRIG", "HDL", "CHOLHDL", "VLDL", "LDLCALC", "LDLDIRECT"   Physical Exam:    VS:  BP 122/78   Pulse 75   Ht 5' (1.524 m)   Wt 174 lb (78.9 kg)   LMP 04/09/2012   SpO2 99%   BMI 33.98 kg/m     Wt Readings from Last 3 Encounters:  08/20/22 174 lb (78.9 kg)  03/19/22 162 lb 6.4 oz (73.7 kg)  11/07/21 153 lb (69.4 kg)    Gen: no distress   Neck: No JVD Cardiac: No Rubs or Gallops, no murmur, RRR +2 radial pulses Respiratory: Clear to auscultation bilaterally, normal effort, normal  respiratory rate GI: Soft, nontender, non-distended  MS: No  edema;  moves all extremities Integument: Skin feels warm Neuro:  At time of evaluation, alert and oriented to person/place/time/situation   Psych: Normal affect, patient feels well   ASSESSMENT:    1. Essential hypertension   2. Familial hypercholesterolemia   3. Family history of early CAD     PLAN:    Small PFO - normal anatomy no change - reviewed CT with patient  HTN - controlled on current therapy  FH Fhx of CAD - continue statin  Offered PRN; she would prefer one year with me      Medication Adjustments/Labs and Tests Ordered: Current medicines are reviewed at length with the patient today.  Concerns regarding medicines are outlined above.  No orders of the defined types were placed in this encounter.  No orders of the defined types were placed in this encounter.   Patient Instructions  Medication Instructions:  Your physician recommends that you continue on your current medications as directed. Please refer to the Current Medication list given to you today.  *If you need a refill on your cardiac medications before your next appointment, please call your pharmacy*   Lab Work: NONE If you have labs (blood work) drawn today and your tests are completely normal, you will receive your results only by: [facility] (if you have MyChart) OR A paper copy in the mail If you have any lab test that is abnormal or  we need to change your treatment, we will call you to review the results.   Testing/Procedures: NONE   Follow-Up: At [facility], you and your health needs are our priority.  As part of our continuing mission to provide you with exceptional heart care, we have created designated Provider Care Teams.  These Care Teams include your primary Cardiologist (physician) and Advanced Practice Providers (APPs -  Physician Assistants and Nurse Practitioners) who all work together to provide you with the care you need, when you need it.   Your next appointment:   1 year(s)  Provider:   Rudean Haskell, MD       Signed, Werner Lean, MD  08/20/2022 8:45 AM    [place]

## 2022-08-20 ENCOUNTER — Encounter: Payer: Self-pay | Admitting: Internal Medicine

## 2022-08-20 ENCOUNTER — Ambulatory Visit: Payer: Medicare Other | Attending: Internal Medicine | Admitting: Internal Medicine

## 2022-08-20 VITALS — BP 122/78 | HR 75 | Ht 60.0 in | Wt 174.0 lb

## 2022-08-20 DIAGNOSIS — I10 Essential (primary) hypertension: Secondary | ICD-10-CM | POA: Diagnosis not present

## 2022-08-20 DIAGNOSIS — E7801 Familial hypercholesterolemia: Secondary | ICD-10-CM | POA: Diagnosis not present

## 2022-08-20 DIAGNOSIS — Z8249 Family history of ischemic heart disease and other diseases of the circulatory system: Secondary | ICD-10-CM

## 2022-08-20 NOTE — Patient Instructions (Signed)
Medication Instructions:  Your physician recommends that you continue on your current medications as directed. Please refer to the Current Medication list given to you today.  *If you need a refill on your cardiac medications before your next appointment, please call your pharmacy*   Lab Work: NONE If you have labs (blood work) drawn today and your tests are completely normal, you will receive your results only by: [place] (if you have MyChart) OR A paper copy in the mail If you have any lab test that is abnormal or we need to change your treatment, we will call you to review the results.   Testing/Procedures: NONE   Follow-Up: At [facility], you and your health needs are our priority.  As part of our continuing mission to provide you with exceptional heart care, we have created designated Provider Care Teams.  These Care Teams include your primary Cardiologist (physician) and Advanced Practice Providers (APPs -  Physician Assistants and Nurse Practitioners) who all work together to provide you with the care you need, when you need it.   Your next appointment:   1 year(s)  Provider:   Rudean Haskell, MD

## 2022-08-26 DIAGNOSIS — M797 Fibromyalgia: Secondary | ICD-10-CM | POA: Diagnosis not present

## 2022-08-26 DIAGNOSIS — R413 Other amnesia: Secondary | ICD-10-CM | POA: Diagnosis not present

## 2022-10-02 DIAGNOSIS — N6489 Other specified disorders of breast: Secondary | ICD-10-CM | POA: Diagnosis not present

## 2022-10-29 DIAGNOSIS — M797 Fibromyalgia: Secondary | ICD-10-CM | POA: Diagnosis not present

## 2022-10-29 DIAGNOSIS — I10 Essential (primary) hypertension: Secondary | ICD-10-CM | POA: Diagnosis not present

## 2022-10-29 DIAGNOSIS — E039 Hypothyroidism, unspecified: Secondary | ICD-10-CM | POA: Diagnosis not present

## 2022-10-29 DIAGNOSIS — F331 Major depressive disorder, recurrent, moderate: Secondary | ICD-10-CM | POA: Diagnosis not present

## 2022-10-29 DIAGNOSIS — E7801 Familial hypercholesterolemia: Secondary | ICD-10-CM | POA: Diagnosis not present

## 2022-11-11 ENCOUNTER — Ambulatory Visit (INDEPENDENT_AMBULATORY_CARE_PROVIDER_SITE_OTHER): Payer: Medicare Other | Admitting: Obstetrics & Gynecology

## 2022-11-11 ENCOUNTER — Encounter: Payer: Self-pay | Admitting: Obstetrics & Gynecology

## 2022-11-11 VITALS — BP 120/80 | HR 98 | Ht 59.75 in | Wt 176.0 lb

## 2022-11-11 DIAGNOSIS — Z01419 Encounter for gynecological examination (general) (routine) without abnormal findings: Secondary | ICD-10-CM

## 2022-11-11 DIAGNOSIS — M8580 Other specified disorders of bone density and structure, unspecified site: Secondary | ICD-10-CM

## 2022-11-11 DIAGNOSIS — Z780 Asymptomatic menopausal state: Secondary | ICD-10-CM

## 2022-11-11 DIAGNOSIS — Z9289 Personal history of other medical treatment: Secondary | ICD-10-CM

## 2022-11-11 NOTE — Progress Notes (Signed)
[name] [birth year] [id number]   History:    57 y.o.  G1P1L1 Married.  Son graduated from [employer].     RP:  Established patient presenting for annual gyn exam    HPI: Postmenopause, well on no HRT.  No PMB.  No pelvic pain.  Not sexually active recently. Pap 10/2020 Neg.  No h/o abnormal Pap. Repeat Pap at 3 years.  Fibromyalgia worse x 07/2018, not working.  Urine/BMs normal. Mild SUI. BMs normal.  Breasts normal. Rt mammo Neg 08/2022 per patient.  Lt Dx mammo/breast US 08/2022 Benign (CSL).  BMI 34.66. Health labs with Fam MD.  Alen Bleacher 8 yrs ago, will repeat at 9 yrs.  BMD: 11-2021 Osteopenia Lt Fem Neck -2.3 stable.  Past medical history,surgical history, family history and social history were all reviewed and documented in the EPIC chart.  Gynecologic History Patient's last menstrual period was 04/09/2012.  Obstetric History OB History  Gravida Para Term Preterm AB Living  2 1 1   1 1   SAB IAB Ectopic Multiple Live Births    1          # Outcome Date GA Lbr Len/2nd Weight Sex Delivery Anes PTL Lv  2 Term           1 IAB              ROS: A ROS was performed and pertinent positives and negatives are included in the history. GENERAL: No fevers or chills. HEENT: No change in vision, no earache, sore throat or sinus congestion. NECK: No pain or stiffness. CARDIOVASCULAR: No chest pain or pressure. No palpitations. PULMONARY: No shortness of breath, cough or wheeze. GASTROINTESTINAL: No abdominal pain, nausea, vomiting or diarrhea, melena or bright red blood per rectum. GENITOURINARY: No urinary frequency, urgency, hesitancy or dysuria. MUSCULOSKELETAL: No joint or muscle pain, no back pain, no recent trauma. DERMATOLOGIC: No rash, no itching, no lesions. ENDOCRINE: No polyuria, polydipsia, no heat or cold intolerance. No recent change in weight. HEMATOLOGICAL: No anemia or easy bruising or bleeding. NEUROLOGIC: No headache, seizures, numbness, tingling or weakness. PSYCHIATRIC: No  depression, no loss of interest in normal activity or change in sleep pattern.     Exam:   BP 120/80   Pulse 98   Ht 4' 11.75" (1.518 m)   Wt 176 lb (79.8 kg)   LMP 04/09/2012   SpO2 99%   BMI 34.66 kg/m   Body mass index is 34.66 kg/m.  General appearance : Well developed well nourished female. No acute distress HEENT: Eyes: no retinal hemorrhage or exudates,  Neck supple, trachea midline, no carotid bruits, no thyroidmegaly Lungs: Clear to auscultation, no rhonchi or wheezes, or rib retractions  Heart: Regular rate and rhythm, no murmurs or gallops Breast:Examined in sitting and supine position were symmetrical in appearance, no palpable masses or tenderness,  no skin retraction, no nipple inversion, no nipple discharge, no skin discoloration, no axillary or supraclavicular lymphadenopathy Abdomen: no palpable masses or tenderness, no rebound or guarding Extremities: no edema or skin discoloration or tenderness  Pelvic: Vulva: Normal             Vagina: No gross lesions or discharge  Cervix: No gross lesions or discharge  Uterus  AV, normal size, shape and consistency, non-tender and mobile  Adnexa  Without masses or tenderness  Anus: Normal   Assessment/Plan:  57 y.o. female for annual exam   1. Well female exam with routine gynecological exam Postmenopause, well on no  HRT.  No PMB.  No pelvic pain.  Not sexually active recently. Pap 10/2020 Neg.  No h/o abnormal Pap. Repeat Pap at 3 years.  Fibromyalgia worse x 07/2018, not working.  Urine/BMs normal. Mild SUI. BMs normal.  Breasts normal. Rt mammo Neg 08/2022 per patient.  Lt Dx mammo/breast US 08/2022 Benign (CSL).  BMI 34.66. Health labs with Fam MD.  Alen Bleacher 8 yrs ago, will repeat at 9 yrs.  BMD: 11-2021 Osteopenia Lt Fem Neck -2.3 stable.  2. Postmenopause Postmenopause, well on no HRT.  No PMB.  No pelvic pain.  Not sexually active recently.   3. Osteopenia, unspecified location BMD: 11-2021 Osteopenia Lt Fem Neck -2.3  stable.  4. Personal history of other medical treatment  Other orders - levothyroxine (SYNTHROID) 75 MCG tablet; Take 75 mcg by mouth daily.   Genia Del MD, 2:12 PM

## 2022-12-17 DIAGNOSIS — E039 Hypothyroidism, unspecified: Secondary | ICD-10-CM | POA: Diagnosis not present

## 2022-12-17 DIAGNOSIS — E7801 Familial hypercholesterolemia: Secondary | ICD-10-CM | POA: Diagnosis not present

## 2023-01-20 DIAGNOSIS — Z8739 Personal history of other diseases of the musculoskeletal system and connective tissue: Secondary | ICD-10-CM | POA: Diagnosis not present

## 2023-01-20 DIAGNOSIS — R4189 Other symptoms and signs involving cognitive functions and awareness: Secondary | ICD-10-CM | POA: Diagnosis not present

## 2023-02-17 DIAGNOSIS — F39 Unspecified mood [affective] disorder: Secondary | ICD-10-CM | POA: Diagnosis not present

## 2023-02-17 DIAGNOSIS — G4733 Obstructive sleep apnea (adult) (pediatric): Secondary | ICD-10-CM | POA: Diagnosis not present

## 2023-02-17 DIAGNOSIS — M797 Fibromyalgia: Secondary | ICD-10-CM | POA: Diagnosis not present

## 2023-02-26 DIAGNOSIS — M797 Fibromyalgia: Secondary | ICD-10-CM | POA: Diagnosis not present

## 2023-05-19 DIAGNOSIS — G4733 Obstructive sleep apnea (adult) (pediatric): Secondary | ICD-10-CM | POA: Diagnosis not present

## 2023-05-19 DIAGNOSIS — M797 Fibromyalgia: Secondary | ICD-10-CM | POA: Diagnosis not present

## 2023-05-19 DIAGNOSIS — E039 Hypothyroidism, unspecified: Secondary | ICD-10-CM | POA: Diagnosis not present

## 2023-06-10 DIAGNOSIS — M810 Age-related osteoporosis without current pathological fracture: Secondary | ICD-10-CM

## 2023-06-10 HISTORY — DX: Age-related osteoporosis without current pathological fracture: M81.0

## 2023-09-14 ENCOUNTER — Telehealth: Payer: Self-pay

## 2023-09-14 NOTE — Telephone Encounter (Signed)
 Last AEX 11/11/2022-ML Next AEX 03/15/2024-BS Last DXA: 12/03/2021-osteopenia  Pt LVM in triage line regarding DXA recall. Needs order.   Ok to offer the pt [facility] or [facility] (where the pt receives her mammo)?

## 2023-09-15 NOTE — Telephone Encounter (Signed)
 Patient choice for location of bone density, either [facility] or [facility].   Diagnosis is osteopenia.

## 2023-09-16 NOTE — Telephone Encounter (Signed)
 Call returned to patient, left detailed message, ok per dpr. Advised per Dr. Edward Jolly, return call to advise where you want the order sent, [phone], opt 4.

## 2023-09-17 NOTE — Telephone Encounter (Signed)
 Patient left message on triage line requesting order for BMD to [facility].   Order printed for Dr. Edward Jolly to review and sign to be faxed to [facility]   Cc: Warren Lacy

## 2023-09-21 NOTE — Telephone Encounter (Signed)
 Order signed and will be faxed by office.

## 2023-09-21 NOTE — Telephone Encounter (Signed)
 Encounter closed

## 2024-03-15 ENCOUNTER — Other Ambulatory Visit (HOSPITAL_COMMUNITY)
Admission: RE | Admit: 2024-03-15 | Discharge: 2024-03-15 | Disposition: A | Source: Ambulatory Visit | Attending: Obstetrics and Gynecology | Admitting: Obstetrics and Gynecology

## 2024-03-15 ENCOUNTER — Ambulatory Visit: Admitting: Obstetrics and Gynecology

## 2024-03-15 ENCOUNTER — Encounter: Payer: Self-pay | Admitting: Obstetrics and Gynecology

## 2024-03-15 ENCOUNTER — Telehealth: Payer: Self-pay | Admitting: Obstetrics and Gynecology

## 2024-03-15 ENCOUNTER — Other Ambulatory Visit: Payer: Self-pay

## 2024-03-15 VITALS — BP 120/76 | HR 90 | Ht 61.25 in | Wt 174.0 lb

## 2024-03-15 DIAGNOSIS — Z780 Asymptomatic menopausal state: Secondary | ICD-10-CM

## 2024-03-15 DIAGNOSIS — R159 Full incontinence of feces: Secondary | ICD-10-CM | POA: Diagnosis not present

## 2024-03-15 DIAGNOSIS — Z1151 Encounter for screening for human papillomavirus (HPV): Secondary | ICD-10-CM | POA: Diagnosis not present

## 2024-03-15 DIAGNOSIS — Z01419 Encounter for gynecological examination (general) (routine) without abnormal findings: Secondary | ICD-10-CM | POA: Insufficient documentation

## 2024-03-15 DIAGNOSIS — Z124 Encounter for screening for malignant neoplasm of cervix: Secondary | ICD-10-CM

## 2024-03-15 DIAGNOSIS — Z9189 Other specified personal risk factors, not elsewhere classified: Secondary | ICD-10-CM

## 2024-03-15 DIAGNOSIS — B009 Herpesviral infection, unspecified: Secondary | ICD-10-CM | POA: Diagnosis not present

## 2024-03-15 DIAGNOSIS — R7989 Other specified abnormal findings of blood chemistry: Secondary | ICD-10-CM

## 2024-03-15 DIAGNOSIS — N393 Stress incontinence (female) (male): Secondary | ICD-10-CM

## 2024-03-15 LAB — VITAMIN D 25 HYDROXY (VIT D DEFICIENCY, FRACTURES): Vit D, 25-Hydroxy: 24 ng/mL — ABNORMAL LOW (ref 30–100)

## 2024-03-15 NOTE — Progress Notes (Unsigned)
 58 y.o. G4P1011 Separated Caucasian female here for a breast and pelvic exam.    Some urinary incontinence with cough, sneeze for a couple of years.  Managing ok with this.   Has some accidental leakage of stool for one year.  No constipation.   Did pelvic floor therapy in the past.    No bulge in the vagina.    No vaginal bleeding.    The patient is also followed for low vit D.    Healing from a broken shoulder.    Son marrying in 2 weeks.   PCP: Sophronia Ozell BROCKS, MD  Rheumatology.   Patient's last menstrual period was 04/09/2012.           Sexually active: No.  The current method of family planning is post menopausal status.    Menopausal hormone therapy:  n/a Exercising: Yes.    Limited due to recent fall  Smoker:  no  OB History     Gravida  2   Para  1   Term  1   Preterm      AB  1   Living  1      SAB      IAB  1   Ectopic      Multiple      Live Births              HEALTH MAINTENANCE: Last 2 paps: 11/06/20 neg, 10/29/17 neg History of abnormal Pap or positive HPV:  no Mammogram:  08/18/23 Breast Density Cat A, BIRADS Cat 2 benign  Colonoscopy:   Bone Density:  12/03/21  Result  low bone mass   Immunization History  Administered Date(s) Administered   Moderna Sars-Covid-2 Vaccination 09/22/2019, 10/25/2019      reports that she has never smoked. She has never used smokeless tobacco. She reports that she does not drink alcohol and does not use drugs.  Past Medical History:  Diagnosis Date   DJD (degenerative joint disease)    knees and ankles   Essential hypertension 07/12/2014   Familial hypercholesterolemia 07/16/2015   Fibromyalgia 06/23/2017   Hematuria    History of endometriosis    History of kidney stones    HSV-1 infection    Hyperlipidemia    MDD (major depressive disorder) 10/21/2018   Migraine with aura and without status migrainosus, not intractable 07/16/2015   Osteopenia 11/2017   T score -2.0 FRAX 6.3% /  0.8%   Pericarditis 04/27/2017   PONV (postoperative nausea and vomiting)    Sleep apnea, obstructive 01/26/2019   Wears contact lenses     Past Surgical History:  Procedure Laterality Date   CYSTOSCOPY WITH RETROGRADE PYELOGRAM, URETEROSCOPY AND STENT PLACEMENT Right 10/04/2014   Procedure: CYSTOSCOPY WITH RETROGRADE PYELOGRAM, URETEROSCOPY ;  Surgeon: Alm Fragmin, MD;  Location: [facility];  Service: Urology;  Laterality: Right;   DILATION AND EVACUATION  1993   HOLMIUM LASER APPLICATION Right 10/04/2014   Procedure: HOLMIUM LASER APPLICATION;  Surgeon: Alm Fragmin, MD;  Location: [facility];  Service: Urology;  Laterality: Right;   KIDNEY STONE SURGERY     LAPAROSCOPY LEFT OVARIAN CYSTECTOMY  06-17-2007   and Partial Left Salpingectomy   LAPAROSCOPY RIGHT TUBAL LIGATION/  REMOVAL IUD/  HYSTEROSCOPY GYNECARE ENDOMETRIAL BALLOON ABLATION  07-14-2003   RIGHT URETEROSCPIC LASER LITHOTRIPSY STONE EXTRACTION  02-15-2007    Current Outpatient Medications  Medication Sig Dispense Refill   amitriptyline (ELAVIL) 25 MG tablet Take 25 mg by mouth  at bedtime.     amLODipine (NORVASC) 5 MG tablet Take 5 mg by mouth every evening.      buPROPion (WELLBUTRIN XL) 150 MG 24 hr tablet Take 150 mg by mouth daily.     celecoxib (CELEBREX) 100 MG capsule Take 100 mg by mouth 2 (two) times daily.     DULoxetine  (CYMBALTA ) 60 MG capsule Take 1 capsule by mouth in the morning and at bedtime.     gabapentin (NEURONTIN) 600 MG tablet Take 2,400 mg by mouth 3 (three) times daily. 600mg  am, 600mg  pm, 1200mg  hs     levothyroxine (SYNTHROID) 75 MCG tablet Take 75 mcg by mouth daily.     rizatriptan (MAXALT-MLT) 10 MG disintegrating tablet Take 10 mg by mouth.     rosuvastatin  (CRESTOR ) 20 MG tablet Take 20 mg by mouth every evening.  3   tiZANidine (ZANAFLEX) 4 MG tablet Take 4 mg by mouth every 8 (eight) hours as needed for muscle spasms.     valACYclovir (VALTREX) 1000 MG  tablet Take 1,000 mg by mouth as needed (fever blister).     nitroGLYCERIN  (NITROSTAT ) 0.4 MG SL tablet Place 1 tablet (0.4 mg total) under the tongue every 5 (five) minutes as needed for chest pain. (Patient not taking: Reported on 03/15/2024) 25 tablet 3   No current facility-administered medications for this visit.    ALLERGIES: Bee venom, Cephalosporins, and Penicillins  Family History  Problem Relation Age of Onset   CAD Father        stents in his 62s   Parkinson's disease Father        Parkinson's disease dementia   Hyperlipidemia Mother    Alzheimer's disease Mother        Symptom onset in late 91s    Review of Systems  All other systems reviewed and are negative.   PHYSICAL EXAM:  BP 120/76 (BP Location: Left Arm, Patient Position: Sitting)   Pulse 90   Ht 5' 1.25 (1.556 m)   Wt 174 lb (78.9 kg)   LMP 04/09/2012   SpO2 96%   BMI 32.61 kg/m     General appearance: alert, cooperative and appears stated age Head: normocephalic, without obvious abnormality, atraumatic Neck: no adenopathy, supple, symmetrical, trachea midline and thyroid  normal to inspection and palpation Lungs: clear to auscultation bilaterally Breasts: normal appearance, no masses or tenderness, No nipple retraction or dimpling, No nipple discharge or bleeding, No axillary adenopathy Heart: regular rate and rhythm Abdomen: soft, non-tender; no masses, no organomegaly Extremities: extremities normal, atraumatic, no cyanosis or edema Skin: skin color, texture, turgor normal. No rashes or lesions Lymph nodes: cervical, supraclavicular, and axillary nodes normal. Neurologic: grossly normal  Pelvic: External genitalia:  no lesions              No abnormal inguinal nodes palpated.              Urethra:  normal appearing urethra with no masses, tenderness or lesions              Bartholins and Skenes: normal                 Vagina: normal appearing vagina with normal color and discharge, no lesions               Cervix: no lesions              Pap taken: yes Bimanual Exam:  Uterus:  normal size, contour, position, consistency, mobility, non-tender  Adnexa: no mass, fullness, tenderness              Rectal exam: yes.  Confirms.              Anus:  normal sphincter tone, no lesions  Chaperone was present for exam:  Joy J, CMA  ASSESSMENT: Encounter for breast and pelvic exam.  Other specified risk factors.  Status post left oophorectomy and partial salpingectomy.  Status post endometrial ablation and right tubal.  Stress urinary incontinence.  Fecal incontinence.  Low vitamin D . Menopausal female.  Osteopenia.  Hx HSV I. Oral.   Fibromyalgia.    PLAN: Mammogram screening discussed. Self breast awareness reviewed. Pap and HRV collected:  yes Guidelines for Calcium , Vitamin D , regular exercise program including cardiovascular and weight bearing exercise. Medication refills:  NA Vit D check.  **** Bone density at [facility]. Follow up:  yearly and prn.     Additional counseling given.  {yes [id number]. ***  total time was spent for this patient encounter, including preparation, face-to-face counseling with the patient, coordination of care, and documentation of the encounter in addition to doing the breast and pelvic exam.

## 2024-03-15 NOTE — Telephone Encounter (Signed)
 Please place referral for [facility] for my patient who has stress incontinence and fecal incontinence.

## 2024-03-15 NOTE — Patient Instructions (Signed)

## 2024-03-15 NOTE — Telephone Encounter (Signed)
 Referral sent

## 2024-03-16 ENCOUNTER — Ambulatory Visit: Payer: Self-pay | Admitting: Obstetrics and Gynecology

## 2024-03-16 DIAGNOSIS — R7989 Other specified abnormal findings of blood chemistry: Secondary | ICD-10-CM

## 2024-03-16 LAB — CYTOLOGY - PAP
Comment: NEGATIVE
Diagnosis: NEGATIVE
High risk HPV: NEGATIVE

## 2024-04-27 LAB — HM DEXA SCAN

## 2024-04-28 ENCOUNTER — Encounter: Payer: Self-pay | Admitting: Obstetrics and Gynecology

## 2024-04-29 ENCOUNTER — Encounter: Payer: Self-pay | Admitting: Obstetrics and Gynecology

## 2024-04-29 ENCOUNTER — Ambulatory Visit: Payer: Self-pay | Admitting: Obstetrics and Gynecology

## 2024-05-10 ENCOUNTER — Ambulatory Visit: Admitting: Obstetrics and Gynecology

## 2024-05-10 NOTE — Progress Notes (Deleted)
 GYNECOLOGY  VISIT   HPI: 58 y.o.   Married  Caucasian female   G2P1011 with Patient's last menstrual period was 04/09/2012.   here for: Discuss dexa      GYNECOLOGIC HISTORY: Patient's last menstrual period was 04/09/2012. Contraception:  PMP Menopausal hormone therapy:  n/a Last 2 paps:  03/15/24 neg HR HPV neg, 11/06/20 neg  History of abnormal Pap or positive HPV:  no Mammogram:  08/18/23 Breast Density Cat A, BIRADS Cat 2 benign         OB History     Gravida  2   Para  1   Term  1   Preterm      AB  1   Living  1      SAB      IAB  1   Ectopic      Multiple      Live Births                 Patient Active Problem List   Diagnosis Date Noted   Family history of early CAD 08/20/2022   Sleep apnea, obstructive 01/26/2019   MDD (major depressive disorder) 10/21/2018   Kidney stones 03/23/2018   Fibromyalgia 06/23/2017   Familial hypercholesterolemia 07/16/2015   Migraine with aura and without status migrainosus, not intractable 07/16/2015   Ureteral calculi 10/04/2014   Essential hypertension 07/12/2014    Past Medical History:  Diagnosis Date   DJD (degenerative joint disease)    knees and ankles   Essential hypertension 07/12/2014   Familial hypercholesterolemia 07/16/2015   Fibromyalgia 06/23/2017   Fracture of shoulder    Hematuria    History of endometriosis    History of kidney stones    HSV-1 infection    Hyperlipidemia    MDD (major depressive disorder) 10/21/2018   Migraine with aura and without status migrainosus, not intractable 07/16/2015   Osteopenia 11/2017   T score -2.0 FRAX 6.3% / 0.8%   Osteoporosis 2025   left hip   Pericarditis 04/27/2017   PONV (postoperative nausea and vomiting)    Sleep apnea, obstructive 01/26/2019   Wears contact lenses     Past Surgical History:  Procedure Laterality Date   CYSTOSCOPY WITH RETROGRADE PYELOGRAM, URETEROSCOPY AND STENT PLACEMENT Right 10/04/2014   Procedure: CYSTOSCOPY WITH  RETROGRADE PYELOGRAM, URETEROSCOPY ;  Surgeon: Alm Fragmin, MD;  Location: [facility];  Service: Urology;  Laterality: Right;   DILATION AND EVACUATION  1993   HOLMIUM LASER APPLICATION Right 10/04/2014   Procedure: HOLMIUM LASER APPLICATION;  Surgeon: Alm Fragmin, MD;  [facility]  Service: Urology;  Laterality: Right;   KIDNEY STONE SURGERY     LAPAROSCOPY LEFT OVARIAN CYSTECTOMY  06-17-2007   and Partial Left Salpingectomy   LAPAROSCOPY RIGHT TUBAL LIGATION/  REMOVAL IUD/  HYSTEROSCOPY GYNECARE ENDOMETRIAL BALLOON ABLATION  07-14-2003   RIGHT URETEROSCPIC LASER LITHOTRIPSY STONE EXTRACTION  02-15-2007    Current Outpatient Medications  Medication Sig Dispense Refill   amitriptyline (ELAVIL) 25 MG tablet Take 25 mg by mouth at bedtime.     amLODipine (NORVASC) 5 MG tablet Take 5 mg by mouth every evening.      buPROPion (WELLBUTRIN XL) 150 MG 24 hr tablet Take 150 mg by mouth daily.     celecoxib (CELEBREX) 100 MG capsule Take 100 mg by mouth 2 (two) times daily.     DULoxetine  (CYMBALTA ) 60 MG capsule Take 1 capsule by mouth in the morning and  at bedtime.     gabapentin (NEURONTIN) 600 MG tablet Take 2,400 mg by mouth 3 (three) times daily. 600mg  am, 600mg  pm, 1200mg  hs     levothyroxine (SYNTHROID) 75 MCG tablet Take 75 mcg by mouth daily.     nitroGLYCERIN  (NITROSTAT ) 0.4 MG SL tablet Place 1 tablet (0.4 mg total) under the tongue every 5 (five) minutes as needed for chest pain. (Patient not taking: Reported on 03/15/2024) 25 tablet 3   rizatriptan (MAXALT-MLT) 10 MG disintegrating tablet Take 10 mg by mouth.     rosuvastatin  (CRESTOR ) 20 MG tablet Take 20 mg by mouth every evening.  3   tiZANidine (ZANAFLEX) 4 MG tablet Take 4 mg by mouth every 8 (eight) hours as needed for muscle spasms.     valACYclovir (VALTREX) 1000 MG tablet Take 1,000 mg by mouth as needed (fever blister).     No current facility-administered medications for this visit.      ALLERGIES: Bee venom, Cephalosporins, and Penicillins  Family History  Problem Relation Age of Onset   CAD Father        stents in his 59s   Parkinson's disease Father        Parkinson's disease dementia   Hyperlipidemia Mother    Alzheimer's disease Mother        Symptom onset in late 14s    Social History   Socioeconomic History   Marital status: Married    Spouse name: Not on file   Number of children: 1   Years of education: 78   Highest education level: Master's degree (e.g., MA, MS, MEng, MEd, MSW, MBA)  Occupational History   Not on file  Tobacco Use   Smoking status: Never   Smokeless tobacco: Never  Vaping Use   Vaping status: Never Used  Substance and Sexual Activity   Alcohol use: No   Drug use: No   Sexual activity: Not Currently    Partners: Male    Birth control/protection: Post-menopausal    Comment: Less than 5, older than 16, no abnormal pap, no cancer, no DES  Other Topics Concern   Not on file  Social History Narrative   Not on file   Social Drivers of Health   Financial Resource Strain: High Risk (10/20/2023)   Received from [facility]   Overall Financial Resource Strain (CARDIA)    Difficulty of Paying Living Expenses: Hard  Food Insecurity: No Food Insecurity (10/20/2023)   Received from [facility]   Hunger Vital Sign    Within the past 12 months, you worried that your food would run out before you got the money to buy more.: Never true    Within the past 12 months, the food you bought just didn't last and you didn't have money to get more.: Never true  Transportation Needs: No Transportation Needs (10/20/2023)   Received from [facility] - Transportation    Lack of Transportation (Medical): No    Lack of Transportation (Non-Medical): No  Physical Activity: Unknown (10/20/2023)   Received from [facility]   Exercise Vital Sign    On average, how many days per week do you engage in moderate to strenuous exercise  (like a brisk walk)?: 0 days    Minutes of Exercise per Session: Not on file  Stress: Stress Concern Present (10/20/2023)   Received from [facility] - Occupational Stress Questionnaire    Feeling of Stress : Rather much  Social Connections: Somewhat Isolated (10/20/2023)   Received from [facility]   Social Network    How would you rate your social network (family, work, friends)?: Restricted participation with some degree of social isolation  Intimate Partner Violence: At Risk (10/20/2023)   Received from [facility]   HITS    Over the last 12 months how often did your partner physically hurt you?: Patient declined    Over the last 12 months how often did your partner insult you or talk down to you?: Frequently    Over the last 12 months how often did your partner threaten you with physical harm?: Sometimes    Over the last 12 months how often did your partner scream or curse at you?: Fairly often    Review of Systems  PHYSICAL EXAMINATION:   LMP 04/09/2012     General appearance: alert, cooperative and appears stated age Head: Normocephalic, without obvious abnormality, atraumatic Neck: no adenopathy, supple, symmetrical, trachea midline and thyroid  normal to inspection and palpation Lungs: clear to auscultation bilaterally Breasts: normal appearance, no masses or tenderness, No nipple retraction or dimpling, No nipple discharge or bleeding, No axillary or supraclavicular adenopathy Heart: regular rate and rhythm Abdomen: soft, non-tender, no masses,  no organomegaly Extremities: extremities normal, atraumatic, no cyanosis or edema Skin: Skin color, texture, turgor normal. No rashes or lesions Lymph nodes: Cervical, supraclavicular, and axillary nodes normal. No abnormal inguinal nodes palpated Neurologic: Grossly normal  Pelvic: External genitalia:  no lesions              Urethra:  normal appearing urethra with no masses,  tenderness or lesions              Bartholins and Skenes: normal                 Vagina: normal appearing vagina with normal color and discharge, no lesions              Cervix: no lesions                Bimanual Exam:  Uterus:  normal size, contour, position, consistency, mobility, non-tender              Adnexa: no mass, fullness, tenderness              Rectal exam: {yes no:314532}.  Confirms.              Anus:  normal sphincter tone, no lesions  Chaperone was present for exam:  {BSCHAPERONE:31226::Emily F, CMA}  ASSESSMENT:    PLAN:    {LABS (Optional):23779}  ***  total time was spent for this patient encounter, including preparation, face-to-face counseling with the patient, coordination of care, and documentation of the encounter.

## 2024-05-18 NOTE — Telephone Encounter (Signed)
 Referral closed by pelvic floor therapy group.  No contact x 2.

## 2024-06-14 NOTE — Progress Notes (Unsigned)
 "  GYNECOLOGY  VISIT   HPI: 59 y.o.  widowed  Caucasian female   [id number] with Patient's last menstrual period was 04/09/2012.   here for: Discuss dexa results      Left hip osteoporosis.   Traumatic left shoulder fracture in 2025 while walking the dog.    No other fractures.     Takes thyroid  medication and is euthroid.    Has fibromyalgia.  On Cymbalta  and gabapentin for this.   No upcoming dental work.  Husband passed away.  She has counseling support.   She recently missed a phone call to schedule pelvic floor therapy, which she would like to pursue.  Hx stress incontinence and hx accidental leakage of stool.  GYNECOLOGIC HISTORY: Patient's last menstrual period was 04/09/2012. Contraception:  PMP Menopausal hormone therapy:  n/a Last 2 paps:  03/15/24 neg, HR HPV neg, 11/06/20 neg  History of abnormal Pap or positive HPV:  no Mammogram:  08/18/23 Breast Density Cat A, BIRADS Cat 2 benign         OB History     Gravida  2   Para  1   Term  1   Preterm      AB  1   Living  1      SAB      IAB  1   Ectopic      Multiple      Live Births                 Patient Active Problem List   Diagnosis Date Noted   Family history of early CAD 08/20/2022   Sleep apnea, obstructive 01/26/2019   MDD (major depressive disorder) 10/21/2018   Kidney stones 03/23/2018   Fibromyalgia 06/23/2017   Familial hypercholesterolemia 07/16/2015   Migraine with aura and without status migrainosus, not intractable 07/16/2015   Ureteral calculi 10/04/2014   Essential hypertension 07/12/2014    Past Medical History:  Diagnosis Date   DJD (degenerative joint disease)    knees and ankles   Essential hypertension 07/12/2014   Familial hypercholesterolemia 07/16/2015   Fibromyalgia 06/23/2017   Fracture of shoulder    Hematuria    History of endometriosis    History of kidney stones    HSV-1 infection    Hyperlipidemia    MDD (major depressive disorder)  10/21/2018   Migraine with aura and without status migrainosus, not intractable 07/16/2015   Osteopenia 11/2017   T score -2.0 FRAX 6.3% / 0.8%   Osteoporosis 2025   left hip   Pericarditis 04/27/2017   PONV (postoperative nausea and vomiting)    Sleep apnea, obstructive 01/26/2019   Wears contact lenses     Past Surgical History:  Procedure Laterality Date   CYSTOSCOPY WITH RETROGRADE PYELOGRAM, URETEROSCOPY AND STENT PLACEMENT Right 10/04/2014   Procedure: CYSTOSCOPY WITH RETROGRADE PYELOGRAM, URETEROSCOPY ;  Surgeon: Alm Fragmin, MD;  Location: [facility];  Service: Urology;  Laterality: Right;   DILATION AND EVACUATION  1993   HOLMIUM LASER APPLICATION Right 10/04/2014   Procedure: HOLMIUM LASER APPLICATION;  Surgeon: Alm Fragmin, MD;  Location: [facility];  Service: Urology;  Laterality: Right;   KIDNEY STONE SURGERY     LAPAROSCOPY LEFT OVARIAN CYSTECTOMY  06-17-2007   and Partial Left Salpingectomy   LAPAROSCOPY RIGHT TUBAL LIGATION/  REMOVAL IUD/  HYSTEROSCOPY GYNECARE ENDOMETRIAL BALLOON ABLATION  07-14-2003   RIGHT URETEROSCPIC LASER LITHOTRIPSY STONE EXTRACTION  02-15-2007    Current Outpatient Medications  Medication Sig Dispense Refill   amLODipine (NORVASC) 5 MG tablet Take 5 mg by mouth every evening.      buPROPion (WELLBUTRIN XL) 150 MG 24 hr tablet Take 150 mg by mouth daily.     celecoxib (CELEBREX) 100 MG capsule Take 100 mg by mouth 2 (two) times daily.     DULoxetine  (CYMBALTA ) 60 MG capsule Take 1 capsule by mouth in the morning and at bedtime.     gabapentin (NEURONTIN) 600 MG tablet Take 2,400 mg by mouth 3 (three) times daily. 600mg  am, 600mg  pm, 1200mg  hs     levothyroxine (SYNTHROID) 75 MCG tablet Take 75 mcg by mouth daily.     rizatriptan (MAXALT-MLT) 10 MG disintegrating tablet Take 10 mg by mouth.     rosuvastatin  (CRESTOR ) 20 MG tablet Take 20 mg by mouth every evening.  3   tiZANidine (ZANAFLEX) 4 MG tablet Take 4  mg by mouth every 8 (eight) hours as needed for muscle spasms.     valACYclovir (VALTREX) 1000 MG tablet Take 1,000 mg by mouth as needed (fever blister).     No current facility-administered medications for this visit.     ALLERGIES: Bee venom, Cephalosporins, and Penicillins  Family History  Problem Relation Age of Onset   CAD Father        stents in his 76s   Parkinson's disease Father        Parkinson's disease dementia   Hyperlipidemia Mother    Alzheimer's disease Mother        Symptom onset in late 37s    Social History   Socioeconomic History   Marital status: Married    Spouse name: Not on file   Number of children: 1   Years of education: 37   Highest education level: Master's degree (e.g., MA, MS, MEng, MEd, MSW, MBA)  Occupational History   Not on file  Tobacco Use   Smoking status: Never   Smokeless tobacco: Never  Vaping Use   Vaping status: Never Used  Substance and Sexual Activity   Alcohol use: No   Drug use: No   Sexual activity: Not Currently    Partners: Male    Birth control/protection: Post-menopausal    Comment: Less than 5, older than 16, no abnormal pap, no cancer, no DES  Other Topics Concern   Not on file  Social History Narrative   Not on file   Social Drivers of Health   Tobacco Use: Low Risk (06/16/2024)   Patient History    Smoking Tobacco Use: Never    Smokeless Tobacco Use: Never    Passive Exposure: Not on file  Financial Resource Strain: High Risk (10/20/2023)   Received from [facility]   Overall Financial Resource Strain (CARDIA)    Difficulty of Paying Living Expenses: Hard  Food Insecurity: No Food Insecurity (10/20/2023)   Received from [facility]   Epic    Within the past 12 months, you worried that your food would run out before you got the money to buy more.: Never true    Within the past 12 months, the food you bought just didn't last and you didn't have money to get more.: Never true  Transportation Needs: No  Transportation Needs (10/20/2023)   Received from [facility] - Transportation    Lack of Transportation (Medical): No    Lack of Transportation (Non-Medical): No  Physical Activity: Unknown (10/20/2023)   Received from [facility]   Exercise Vital Sign  On average, how many days per week do you engage in moderate to strenuous exercise (like a brisk walk)?: 0 days    Minutes of Exercise per Session: Not on file  Stress: Stress Concern Present (10/20/2023)   Received from [facility] - Occupational Stress Questionnaire    Feeling of Stress : Rather much  Social Connections: Somewhat Isolated (10/20/2023)   Received from [facility]   Social Network    How would you rate your social network (family, work, friends)?: Restricted participation with some degree of social isolation  Intimate Partner Violence: At Risk (10/20/2023)   Received from [facility]   HITS    Over the last 12 months how often did your partner physically hurt you?: Patient declined    Over the last 12 months how often did your partner insult you or talk down to you?: Frequently    Over the last 12 months how often did your partner threaten you with physical harm?: Sometimes    Over the last 12 months how often did your partner scream or curse at you?: Fairly often  Depression (PHQ2-9): Not on file  Alcohol Screen: Not on file  Housing: High Risk (10/20/2023)   Received from [facility]    In the last 12 months, was there a time when you were not able to pay the mortgage or rent on time?: Yes    In the past 12 months, how many times have you moved where you were living?: 1    At any time in the past 12 months, were you homeless or living in a shelter (including now)?: No  Utilities: Not At Risk (10/20/2023)   Received from [facility] Utilities    Threatened with loss of utilities: No  Health Literacy: Not on file    Review of Systems   All other systems reviewed and are negative.   PHYSICAL EXAMINATION:   BP 128/84 (BP Location: Left Arm, Patient Position: Sitting)   Pulse 86   LMP 04/09/2012   SpO2 97%     General appearance: alert, cooperative and appears stated age  ASSESSMENT:  Osteoporosis.  Hx traumatic shoulder fracture.  Hx pericarditis.    Fibromyalgia.  Hx stress incontinence and fecal incontinence.   Bereavement.   PLAN:  Osteoporosis reviewed including its silent nature until a fracture occurs. Risk factors reviewed. CBC, ph, CMP, PTH.  Recommendation for calcium  1200 mg daily and vit D 600 - 800 international units through food sources, supplements, or both. Weight bearing exercise also recommended. Tx options Fosamax, Prolia, Reclast, and Evenity discussed.  We focused on Prolia today as a treatment option.   She will call about pelvic floor PT.  Information given.  Support given for her bereavement.   30 min  total time was spent for this patient encounter, including preparation, face-to-face counseling with the patient, coordination of care, and documentation of the encounter.    "

## 2024-06-16 ENCOUNTER — Ambulatory Visit (INDEPENDENT_AMBULATORY_CARE_PROVIDER_SITE_OTHER): Admitting: Obstetrics and Gynecology

## 2024-06-16 ENCOUNTER — Encounter: Payer: Self-pay | Admitting: Obstetrics and Gynecology

## 2024-06-16 VITALS — BP 128/84 | HR 86

## 2024-06-16 DIAGNOSIS — N393 Stress incontinence (female) (male): Secondary | ICD-10-CM

## 2024-06-16 DIAGNOSIS — M810 Age-related osteoporosis without current pathological fracture: Secondary | ICD-10-CM

## 2024-06-16 DIAGNOSIS — R159 Full incontinence of feces: Secondary | ICD-10-CM

## 2024-06-16 NOTE — Patient Instructions (Signed)
 Denosumab Injection (Osteoporosis) What is this medication? DENOSUMAB (den oh SUE mab) prevents and treats osteoporosis. It works by Interior and spatial designer stronger and less likely to break (fracture). It is a monoclonal antibody. This medicine may be used for other purposes; ask your health care provider or pharmacist if you have questions. COMMON BRAND NAME(S): Prolia What should I tell my care team before I take this medication? They need to know if you have any of these conditions: Dental or gum disease Had thyroid  or parathyroid (glands located in neck) surgery Having dental surgery or a tooth pulled Kidney disease Low levels of calcium in the blood On dialysis Poor nutrition Thyroid  disease Trouble absorbing nutrients from your food An unusual or allergic reaction to denosumab, other medications, foods, dyes, or preservatives Pregnant or trying to get pregnant Breastfeeding How should I use this medication? This medication is injected under the skin. It is given by your care team in a hospital or clinic setting. A special MedGuide will be given to you before each treatment. Be sure to read this information carefully each time. Talk to your care team about the use of this medication in children. Special care may be needed. Overdosage: If you think you have taken too much of this medicine contact a poison control center or emergency room at once. NOTE: This medicine is only for you. Do not share this medicine with others. What if I miss a dose? Keep appointments for follow-up doses. It is important not to miss your dose. Call your care team if you are unable to keep an appointment. What may interact with this medication? Do not take this medication with any of the following: Other medications that contain denosumab This medication may also interact with the following: Medications that lower your chance of fighting infection Steroid medications, such as prednisone  or cortisone This  list may not describe all possible interactions. Give your health care provider a list of all the medicines, herbs, non-prescription drugs, or dietary supplements you use. Also tell them if you smoke, drink alcohol, or use illegal drugs. Some items may interact with your medicine. What should I watch for while using this medication? Your condition will be monitored carefully while you are receiving this medication. You may need blood work done while taking this medication. This medication may increase your risk of getting an infection. Call your care team for advice if you get a fever, chills, sore throat, or other symptoms of a cold or flu. Do not treat yourself. Try to avoid being around people who are sick. Tell your dentist and dental surgeon that you are taking this medication. You should not have major dental surgery while on this medication. See your dentist to have a dental exam and fix any dental problems before starting this medication. Take good care of your teeth while on this medication. Make sure you see your dentist for regular follow-up appointments. This medication may cause low levels of calcium in your body. The risk of severe side effects is increased in people with kidney disease. Your care team may prescribe calcium and vitamin D  to help prevent low calcium levels while you take this medication. It is important to take calcium and vitamin D  as directed by your care team. Talk to your care team if you may be pregnant. Serious birth defects may occur if you take this medication during pregnancy and for 5 months after the last dose. You will need a negative pregnancy test before starting this medication. Contraception  is recommended while taking this medication and for 5 months after the last dose. Your care team can help you find the option that works for you. Talk to your care team before breastfeeding. Changes to your treatment plan may be needed. What side effects may I notice from  receiving this medication? Side effects that you should report to your care team as soon as possible: Allergic reactions--skin rash, itching, hives, swelling of the face, lips, tongue, or throat Infection--fever, chills, cough, sore throat, wounds that don't heal, pain or trouble when passing urine, general feeling of discomfort or being unwell Low calcium level--muscle pain or cramps, confusion, tingling, or numbness in the hands or feet Osteonecrosis of the jaw--pain, swelling, or redness in the mouth, numbness of the jaw, poor healing after dental work, unusual discharge from the mouth, visible bones in the mouth Severe bone, joint, or muscle pain Skin infection--skin redness, swelling, warmth, or pain Side effects that usually do not require medical attention (report these to your care team if they continue or are bothersome): Back pain Headache Joint pain Muscle pain Pain in the hands, arms, legs, or feet Runny or stuffy nose Sore throat This list may not describe all possible side effects. Call your doctor for medical advice about side effects. You may report side effects to FDA at 1-800-FDA-1088. Where should I keep my medication? This medication is given in a hospital or clinic. It will not be stored at home. NOTE: This sheet is a summary. It may not cover all possible information. If you have questions about this medicine, talk to your doctor, pharmacist, or health care provider.  2024 Elsevier/Gold Standard (2022-07-01 00:00:00) Weak, Fragile Bones (Osteoporosis): What to Know  Osteoporosis is when the bones become thin and less dense than normal. Osteoporosis makes bones more fragile and more likely to break. Over time, the condition can cause your bones to become so weak that they break, or fracture, after a minor fall. Bones in the hip, wrist, and spine are most likely to break. What are the causes? The exact cause of this condition is not known. What increases the  risk? Having family members with this condition. Taking: Steroid medicines. Anti-seizure medicines. Being female. Being 65 years old or older. Being of European decent. Smoking or using other products that contain nicotine or tobacco. Not exercising. What are the signs or symptoms? A broken bone might be the first sign, especially if the break results from a fall or injury that usually would not cause a bone to break. Other signs and symptoms include: Pain in the neck or low back. Being hunched over. Getting shorter. How is this diagnosed? This condition may be diagnosed based on: Your medical history. A physical exam. A bone mineral density test, also called a DXA or DEXA test. This test uses X-rays to measure how dense your bones are. How is this treated? This condition may be treated with medicines and supplements, including: Taking medicines to slow bone loss or help make the bones stronger. Taking calcium and vitamin D  supplements every day. Taking hormone replacement medicines, such as estrogen for female and testosterone for males. It may also be treated by: Quitting smoking or using tobacco products. Doing exercises. Limiting how much alcohol you drink. Eating more foods with calcium and vitamin D  in them. Monitoring your blood levels of calcium and vitamin D . The goal of treatment is to strengthen your bones and lower your risk for a bone break. Follow these instructions at home: Eating  and drinking Eat plenty of food with calcium and vitamin D  in them. This may include: Some fish, such as salmon and tuna. Foods that have calcium and vitamin D  added to them, such as some cereals. Egg yolks. Cheese. Liver.  Activity Exercise as told. Ask what things are safe for you to do. You may be told to: Do exercises that make your muscles work to hold your body weight up, such as tai chi, yoga, or walking. These are called weight-bearing exercises. Do exercises to make your  muscles stronger, such as lifting weights. These are called muscle-strengthening exercises. Lifestyle Do not drink alcohol if your health care provider tells you not to drink. Your health care provider tells you not to drink. You are pregnant, may be pregnant, or are planning to become pregnant. If you drink alcohol: Limit how much you have to: 0-1 drink a day if you're female. 0-2 drinks a day if you're female. Know how much alcohol is in your drink. In the [place] one drink is one 12 oz bottle of beer (355 mL), one 5 oz glass of wine (148 mL), or one 1 oz glass of hard liquor (44 mL). Do not smoke, vape, or use nicotine or tobacco. Preventing falls Use a cane, walker, scooter, or crutches to help you move around if needed. Keep rooms well-lit and get rid of clutter. Put away things on the floor that could make you trip, such as cords and rugs. Put grab bars in bathrooms and safety rails on stairs. Use rubber mats in slippery areas, like bathrooms. Wear shoes that: Fit you well. Support your feet. Have closed toes. Have rubber soles or low heels. Talk to your provider about all of the medicines you take. Some medicines can make you more likely to fall because they can cause dizziness or changes in blood pressure. General instructions Take your medicines only as told. Keep all follow-up visits. Your provider may want to repeat tests. Where to find more information Bone Health and Osteoporosis Foundation: bonehealthandosteoporosis.org Contact a health care provider if: You have never been screened for osteoporosis and you are: A female who is 79 years old or older. A female who is age 32 years old or older. Get help right away if: You fall. You get hurt. This information is not intended to replace advice given to you by your health care provider. Make sure you discuss any questions you have with your health care provider. Document Revised: 04/07/2023 Document Reviewed:  12/12/2022 Elsevier Patient Education  2024 [employer].

## 2024-06-17 ENCOUNTER — Ambulatory Visit: Payer: Self-pay | Admitting: Obstetrics and Gynecology

## 2024-06-17 LAB — COMPREHENSIVE METABOLIC PANEL WITH GFR
AG Ratio: 1.5 (calc) (ref 1.0–2.5)
ALT: 13 U/L (ref 6–29)
AST: 18 U/L (ref 10–35)
Albumin: 4.3 g/dL (ref 3.6–5.1)
Alkaline phosphatase (APISO): 164 U/L — ABNORMAL HIGH (ref 37–153)
BUN: 12 mg/dL (ref 7–25)
CO2: 25 mmol/L (ref 20–32)
Calcium: 9.4 mg/dL (ref 8.6–10.4)
Chloride: 103 mmol/L (ref 98–110)
Creat: 0.88 mg/dL (ref 0.50–1.03)
Globulin: 2.9 g/dL (ref 1.9–3.7)
Glucose, Bld: 128 mg/dL — ABNORMAL HIGH (ref 65–99)
Potassium: 4.2 mmol/L (ref 3.5–5.3)
Sodium: 142 mmol/L (ref 135–146)
Total Bilirubin: 0.5 mg/dL (ref 0.2–1.2)
Total Protein: 7.2 g/dL (ref 6.1–8.1)
eGFR: 76 mL/min/1.73m2

## 2024-06-17 LAB — CBC
HCT: 44.1 % (ref 35.9–46.0)
Hemoglobin: 14 g/dL (ref 11.7–15.5)
MCH: 26.8 pg — ABNORMAL LOW (ref 27.0–33.0)
MCHC: 31.7 g/dL (ref 31.6–35.4)
MCV: 84.3 fL (ref 81.4–101.7)
MPV: 12.2 fL (ref 7.5–12.5)
Platelets: 330 Thousand/uL (ref 140–400)
RBC: 5.23 Million/uL — ABNORMAL HIGH (ref 3.80–5.10)
RDW: 12.5 % (ref 11.0–15.0)
WBC: 7.7 Thousand/uL (ref 3.8–10.8)

## 2024-06-17 LAB — PHOSPHORUS: Phosphorus: 4.2 mg/dL (ref 2.5–4.5)

## 2024-06-17 LAB — PARATHYROID HORMONE, INTACT (NO CA): PTH: 61 pg/mL (ref 16–77)

## 2024-06-21 ENCOUNTER — Other Ambulatory Visit: Payer: Self-pay | Admitting: Obstetrics and Gynecology

## 2024-06-21 DIAGNOSIS — R748 Abnormal levels of other serum enzymes: Secondary | ICD-10-CM

## 2024-06-21 DIAGNOSIS — R739 Hyperglycemia, unspecified: Secondary | ICD-10-CM

## 2024-07-11 ENCOUNTER — Other Ambulatory Visit

## 2024-07-15 ENCOUNTER — Other Ambulatory Visit

## 2024-07-15 DIAGNOSIS — R739 Hyperglycemia, unspecified: Secondary | ICD-10-CM

## 2024-07-15 DIAGNOSIS — R748 Abnormal levels of other serum enzymes: Secondary | ICD-10-CM

## 2024-07-15 DIAGNOSIS — R7989 Other specified abnormal findings of blood chemistry: Secondary | ICD-10-CM

## 2024-07-15 LAB — VITAMIN D 25 HYDROXY (VIT D DEFICIENCY, FRACTURES): Vit D, 25-Hydroxy: 37 ng/mL (ref 30–100)

## 2025-03-20 ENCOUNTER — Encounter: Admitting: Obstetrics and Gynecology
